# Patient Record
Sex: Male | Born: 1980 | Race: White | Hispanic: No | Marital: Single | Smoking: Never smoker
Health system: Southern US, Community
[De-identification: ages and names within clinical notes are randomized; demographics above are authoritative.]

## PROBLEM LIST (undated history)

## (undated) DIAGNOSIS — T7840XA Allergy, unspecified, initial encounter: Secondary | ICD-10-CM

---

## 2005-09-16 ENCOUNTER — Emergency Department (HOSPITAL_COMMUNITY): Admission: EM | Admit: 2005-09-16 | Discharge: 2005-09-16 | Payer: Self-pay | Admitting: Emergency Medicine

## 2007-03-23 ENCOUNTER — Ambulatory Visit: Payer: Self-pay | Admitting: Internal Medicine

## 2007-03-24 ENCOUNTER — Ambulatory Visit: Payer: Self-pay | Admitting: *Deleted

## 2009-02-12 ENCOUNTER — Emergency Department (HOSPITAL_COMMUNITY): Admission: EM | Admit: 2009-02-12 | Discharge: 2009-02-12 | Payer: Self-pay | Admitting: Emergency Medicine

## 2010-04-07 LAB — POCT I-STAT, CHEM 8
BUN: 22 mg/dL (ref 6–23)
Chloride: 105 mEq/L (ref 96–112)
Creatinine, Ser: 1.2 mg/dL (ref 0.4–1.5)
Sodium: 138 mEq/L (ref 135–145)

## 2016-03-02 ENCOUNTER — Encounter (HOSPITAL_COMMUNITY): Payer: Self-pay

## 2016-03-02 DIAGNOSIS — R112 Nausea with vomiting, unspecified: Secondary | ICD-10-CM | POA: Insufficient documentation

## 2016-03-02 DIAGNOSIS — R509 Fever, unspecified: Secondary | ICD-10-CM | POA: Insufficient documentation

## 2016-03-02 MED ORDER — ACETAMINOPHEN 325 MG PO TABS
650.0000 mg | ORAL_TABLET | Freq: Once | ORAL | Status: AC
  Administered 2016-03-02: 650 mg via ORAL

## 2016-03-02 MED ORDER — ACETAMINOPHEN 325 MG PO TABS
ORAL_TABLET | ORAL | Status: AC
  Filled 2016-03-02: qty 2

## 2016-03-02 NOTE — ED Triage Notes (Signed)
Pt complaining of nausea and vomiting since this morning. Pt complaining of fevers/chills. Pt also complaining of generalized body aches.

## 2016-03-03 ENCOUNTER — Emergency Department (HOSPITAL_COMMUNITY)
Admission: EM | Admit: 2016-03-03 | Discharge: 2016-03-03 | Disposition: A | Discharge status: Home / Self Care | Payer: Self-pay | Attending: Emergency Medicine | Admitting: Emergency Medicine

## 2016-03-03 DIAGNOSIS — R509 Fever, unspecified: Secondary | ICD-10-CM

## 2016-03-03 DIAGNOSIS — R112 Nausea with vomiting, unspecified: Secondary | ICD-10-CM

## 2016-03-03 MED ORDER — ONDANSETRON HCL 4 MG PO TABS: 4.0000 mg | ORAL_TABLET | Freq: Three times a day (TID) | ORAL | 0 refills | Status: DC | PRN

## 2016-03-03 MED ORDER — SODIUM CHLORIDE 0.9 % IV BOLUS (SEPSIS)
1000.0000 mL | Freq: Once | INTRAVENOUS | Status: AC
  Administered 2016-03-03: 1000 mL via INTRAVENOUS

## 2016-03-03 NOTE — ED Provider Notes (Signed)
[facility]-EMERGENCY DEPT Provider Note   CSN: [id number] Arrival date & time: 03/02/16  1843     History   Chief Complaint Chief Complaint  Patient presents with  . Generalized Body Aches  . Emesis    HPI [facility] is a 36 y.o. male.  Patient presents with nausea, vomiting, chills and generalized body aches since this morning (03/02/16). He was unaware he had a fever until seen in the emergency department. No diarrhea, congestion, cough, sore throat. He reports sick family members with similar symptoms. He denies significant pain. No hematemesis.   The history is provided by the patient. No language interpreter was used.  Emesis   Associated symptoms include myalgias. Pertinent negatives include no chills, no fever and no headaches.    History reviewed. No pertinent past medical history.  There are no active problems to display for this patient.   History reviewed. No pertinent surgical history.     Home Medications    Prior to Admission medications   Not on File    Family History History reviewed. No pertinent family history.  Social History Social History  Substance Use Topics  . Smoking status: Never Smoker  . Smokeless tobacco: Never Used  . Alcohol use No     Allergies   Contrast media [iodinated diagnostic agents]   Review of Systems Review of Systems  Constitutional: Negative for chills and fever.  HENT: Negative.  Negative for congestion.   Respiratory: Negative.  Negative for shortness of breath.   Cardiovascular: Negative.  Negative for chest pain.  Gastrointestinal: Positive for nausea and vomiting.  Genitourinary: Negative.  Negative for decreased urine volume.  Musculoskeletal: Positive for myalgias.  Skin: Negative.   Neurological: Negative.  Negative for syncope, weakness and headaches.     Physical Exam Updated Vital Signs BP 131/87   Pulse 114   Temp 99.2 F (37.3 C)   Resp 18   SpO2 97%   Physical Exam    Constitutional: He is oriented to person, place, and time. He appears well-developed and well-nourished.  HENT:  Head: Normocephalic.  Mouth/Throat: Oropharynx is clear and moist.  Neck: Normal range of motion. Neck supple.  Cardiovascular: Regular rhythm.  Tachycardia present.   Pulmonary/Chest: Effort normal and breath sounds normal. He has no wheezes. He has no rales.  Abdominal: Soft. Bowel sounds are normal. There is no tenderness. There is no rebound and no guarding.  Musculoskeletal: Normal range of motion.  Neurological: He is alert and oriented to person, place, and time.  Skin: Skin is warm and dry. No rash noted.  Psychiatric: He has a normal mood and affect.     ED Treatments / Results  Labs (all labs ordered are listed, but only abnormal results are displayed) Labs Reviewed - No data to display  EKG  EKG Interpretation None       Radiology No results found.  Procedures Procedures (including critical care time)  Medications Ordered in ED Medications  acetaminophen (TYLENOL) 325 MG tablet (not administered)  sodium chloride 0.9 % bolus 1,000 mL (not administered)  acetaminophen (TYLENOL) tablet 650 mg (650 mg Oral Given 03/02/16 2152)     Initial Impression / Assessment and Plan / ED Course  I have reviewed the triage vital signs and the nursing notes.  Pertinent labs & imaging results that were available during my care of the patient were reviewed by me and considered in my medical decision making (see chart for details).     Patient presents  with N, V, fever, body aches. No URI symptoms. He is tachycardic on arrival, feels generally ill. IVF's given with zofran. Patient feeling much better on recheck. Tolerating PO fluids. Tachycardia resolved. He is felt appropriate for discharge home.   Final Clinical Impressions(s) / ED Diagnoses   Final diagnoses:  None   1. Nausea and vomiting 2. Fever  New Prescriptions New Prescriptions   No medications  on file     Elpidio Anis, PA-C 03/03/16 0401    Layla Maw Ward, DO 03/03/16 0505

## 2016-12-07 ENCOUNTER — Ambulatory Visit
Admission: RE | Admit: 2016-12-07 | Discharge: 2016-12-07 | Disposition: A | Discharge status: Home / Self Care | Payer: Worker's Compensation | Source: Ambulatory Visit | Attending: Nurse Practitioner | Admitting: Nurse Practitioner

## 2016-12-07 ENCOUNTER — Other Ambulatory Visit: Payer: Self-pay | Admitting: Nurse Practitioner

## 2016-12-07 DIAGNOSIS — R52 Pain, unspecified: Secondary | ICD-10-CM

## 2017-11-16 ENCOUNTER — Encounter (HOSPITAL_COMMUNITY): Payer: Self-pay | Admitting: Emergency Medicine

## 2017-11-16 ENCOUNTER — Emergency Department (HOSPITAL_COMMUNITY)
Admission: EM | Admit: 2017-11-16 | Discharge: 2017-11-16 | Disposition: A | Discharge status: Home / Self Care | Payer: Self-pay | Attending: Emergency Medicine | Admitting: Emergency Medicine

## 2017-11-16 ENCOUNTER — Other Ambulatory Visit: Payer: Self-pay

## 2017-11-16 DIAGNOSIS — L853 Xerosis cutis: Secondary | ICD-10-CM

## 2017-11-16 DIAGNOSIS — L989 Disorder of the skin and subcutaneous tissue, unspecified: Secondary | ICD-10-CM | POA: Insufficient documentation

## 2017-11-16 MED ORDER — TRIAMCINOLONE ACETONIDE 0.1 % EX CREA: 1.0000 "application " | TOPICAL_CREAM | Freq: Two times a day (BID) | CUTANEOUS | 0 refills | Status: AC

## 2017-11-16 NOTE — ED Notes (Signed)
ED Provider at bedside. 

## 2017-11-16 NOTE — ED Triage Notes (Signed)
Pt reports his hands are red, swollen and peeling, causing him great pain.  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

## 2017-11-16 NOTE — ED Provider Notes (Signed)
[facility] Provider Note   CSN: [id number] Arrival date & time: 11/16/17  0137     History   Chief Complaint Chief Complaint  Patient presents with  . Hand Pain    HPI [facility] is a 37 y.o. male.  The history is provided by the patient and medical records.     37 y.o. M here with bilateral hand pain.  He works as a Theatre stage manager at [employer] and for the past few weeks the skin on his hands has been dry, scaling, and sometimes blistering.  States on days he does not work he keeps lotion on his hand which helps but then as soon as he returns to work it starts again.  He does use industrial strength dish detergent with some type of sanitzer--unsure of exact chemical make-up.  States it causes the skin on his hands to burn and causes great pain when trying to use his hands.  He denies any fever/chills.    History reviewed. No pertinent past medical history.  There are no active problems to display for this patient.   History reviewed. No pertinent surgical history.      Home Medications    Prior to Admission medications   Medication Sig Start Date End Date Taking? Authorizing Provider  ondansetron (ZOFRAN) 4 MG tablet Take 1 tablet (4 mg total) by mouth every 8 (eight) hours as needed for nausea or vomiting. 03/03/16   Elpidio Anis, PA-C    Family History No family history on file.  Social History Social History   Tobacco Use  . Smoking status: Never Smoker  . Smokeless tobacco: Never Used  Substance Use Topics  . Alcohol use: No  . Drug use: No     Allergies   Contrast media [iodinated diagnostic agents]   Review of Systems Review of Systems  Skin:       Dry skin/scaling  All other systems reviewed and are negative.    Physical Exam Updated Vital Signs BP (!) 156/103 (BP Location: Right Arm)   Pulse (!) 110   Temp 98 F (36.7 C) (Oral)   Resp 16   Ht 5\' 8"  (1.727 m)   Wt 97.5 kg   SpO2 98%    BMI 32.69 kg/m   Physical Exam  Constitutional: He is oriented to person, place, and time. He appears well-developed and well-nourished.  HENT:  Head: Normocephalic and atraumatic.  Mouth/Throat: Oropharynx is clear and moist.  Eyes: Pupils are equal, round, and reactive to light. Conjunctivae and EOM are normal.  Neck: Normal range of motion.  Cardiovascular: Normal rate, regular rhythm and normal heart sounds.  Pulmonary/Chest: Effort normal and breath sounds normal. No stridor. No respiratory distress.  Abdominal: Soft. Bowel sounds are normal. There is no tenderness. There is no rebound.  Musculoskeletal: Normal range of motion.  Bilateral palms with dry, scaling, cracked skin; some minor areas of blisters noted; no drainage or bleeding; no signs of abscess formation  Neurological: He is alert and oriented to person, place, and time.  Skin: Skin is warm and dry.  Psychiatric: He has a normal mood and affect.  Nursing note and vitals reviewed.    ED Treatments / Results  Labs (all labs ordered are listed, but only abnormal results are displayed) Labs Reviewed - No data to display  EKG None  Radiology No results found.  Procedures Procedures (including critical care time)  Medications Ordered in ED Medications - No data to display  Initial Impression / Assessment and Plan / ED Course  I have reviewed the triage vital signs and the nursing notes.  Pertinent labs & imaging results that were available during my care of the patient were reviewed by me and considered in my medical decision making (see chart for details).  37 y.o. M here with bilateral hand pain.  Has dry, cracked, scaling skin with some areas of blistering on his palms.  There are no signs of infection.  Suspect this is likely from continuous immersion into the water while washing dishes, possibly some chemical component given industrial strength cleaners and soaps.  Will start on Kenalog ointment,  discussed trying to keep his hands as moist as possible, can try using salt/gloves over the hands at night after application of Kenalog.  Discussed also using Vaseline intermittently and continue use of lotions.  Can follow-up with PCP if any ongoing issues.  He will return here for any new or worsening symptoms.  Final Clinical Impressions(s) / ED Diagnoses   Final diagnoses:  Dry skin    ED Discharge Orders         Ordered    triamcinolone cream (KENALOG) 0.1 %  2 times daily     11/16/17 0404           Garlon Hatchet, PA-C 11/16/17 Harley Alto, MD 11/16/17 [phone]

## 2017-11-16 NOTE — Discharge Instructions (Signed)
Take the prescribed medication as directed.  Try to keep hands moisturized-- can use lotion, vaseline, etc.  May help to cover hands with socks/gloves at night to keep moisture in. Follow-up with your primary care doctor. Return to the ED for new or worsening symptoms.

## 2018-03-21 ENCOUNTER — Other Ambulatory Visit: Payer: Self-pay

## 2018-03-21 ENCOUNTER — Emergency Department (HOSPITAL_COMMUNITY)
Admission: EM | Admit: 2018-03-21 | Discharge: 2018-03-21 | Disposition: A | Discharge status: Home / Self Care | Payer: Self-pay | Attending: Emergency Medicine | Admitting: Emergency Medicine

## 2018-03-21 ENCOUNTER — Encounter (HOSPITAL_COMMUNITY): Payer: Self-pay | Admitting: *Deleted

## 2018-03-21 DIAGNOSIS — J020 Streptococcal pharyngitis: Secondary | ICD-10-CM | POA: Insufficient documentation

## 2018-03-21 LAB — GROUP A STREP BY PCR: Group A Strep by PCR: DETECTED — AB

## 2018-03-21 MED ORDER — PENICILLIN G BENZATHINE 1200000 UNIT/2ML IM SUSP
1.2000 10*6.[IU] | Freq: Once | INTRAMUSCULAR | Status: AC
  Administered 2018-03-21: 1.2 10*6.[IU] via INTRAMUSCULAR
  Filled 2018-03-21: qty 2

## 2018-03-21 MED ORDER — DEXAMETHASONE SODIUM PHOSPHATE 10 MG/ML IJ SOLN
10.0000 mg | Freq: Once | INTRAMUSCULAR | Status: AC
  Administered 2018-03-21: 10 mg via INTRAMUSCULAR
  Filled 2018-03-21: qty 1

## 2018-03-21 MED ORDER — KETOROLAC TROMETHAMINE 15 MG/ML IJ SOLN
15.0000 mg | Freq: Once | INTRAMUSCULAR | Status: DC
  Filled 2018-03-21: qty 1

## 2018-03-21 MED ORDER — IBUPROFEN 400 MG PO TABS
400.0000 mg | ORAL_TABLET | Freq: Once | ORAL | Status: AC
  Administered 2018-03-21: 400 mg via ORAL
  Filled 2018-03-21: qty 1

## 2018-03-21 NOTE — Discharge Instructions (Addendum)
As discussed, your evaluation today has been largely reassuring.  But, it is important that you monitor your condition carefully, and do not hesitate to return to the ED if you develop new, or concerning changes in your condition. ? ?Otherwise, please follow-up with your physician for appropriate ongoing care. ? ?

## 2018-03-21 NOTE — ED Provider Notes (Signed)
[facility] EMERGENCY DEPARTMENT Provider Note   CSN: [id number] Arrival date & time: 03/21/18  1706    History   Chief Complaint Chief Complaint  Patient presents with  . Sore Throat  . Fever    HPI [facility] is a 38 y.o. male.     HPI Presents with concern of sore throat, fever. Patient was well until earlier today He noticed throat fullness, symmetric, with soreness with swallowing, and had subjective fever earlier in the day. No ongoing fever, nausea, vomiting, or other complaints. No medication taken for pain relief per Patient was well prior to the onset of symptoms. He works in a [employer]. History reviewed. No pertinent past medical history.  There are no active problems to display for this patient.   History reviewed. No pertinent surgical history.      Home Medications    Prior to Admission medications   Medication Sig Start Date End Date Taking? Authorizing Provider  acetaminophen (TYLENOL) 500 MG tablet Take 1,000 mg by mouth every 6 (six) hours as needed for mild pain or fever.   Yes [provider]  ondansetron (ZOFRAN) 4 MG tablet Take 1 tablet (4 mg total) by mouth every 8 (eight) hours as needed for nausea or vomiting. Patient not taking: Reported on 03/21/2018 03/03/16   Elpidio Anis, PA-C  triamcinolone cream (KENALOG) 0.1 % Apply 1 application topically 2 (two) times daily. Patient not taking: Reported on 03/21/2018 11/16/17   Garlon Hatchet, PA-C    Family History No family history on file.  Social History Social History   Tobacco Use  . Smoking status: Never Smoker  . Smokeless tobacco: Never Used  Substance Use Topics  . Alcohol use: No  . Drug use: No     Allergies   Contrast media [iodinated diagnostic agents]   Review of Systems Review of Systems  Constitutional:       Per HPI, otherwise negative  HENT:       Per HPI, otherwise negative  Respiratory:       Per HPI,  otherwise negative  Cardiovascular:       Per HPI, otherwise negative  Gastrointestinal: Negative for vomiting.  Endocrine:       Negative aside from HPI  Genitourinary:       Neg aside from HPI   Musculoskeletal:       Per HPI, otherwise negative  Skin: Negative.   Neurological: Negative for syncope.     Physical Exam Updated Vital Signs BP (!) 135/99 (BP Location: Right Arm)   Pulse 79   Temp 98 F (36.7 C) (Oral)   Resp 16   SpO2 95%   Physical Exam Vitals signs and nursing note reviewed.  Constitutional:      General: He is not in acute distress.    Appearance: He is well-developed.  HENT:     Head: Normocephalic and atraumatic.     Comments: Mild discomfort with palpation of the throat, no gross posterior oropharyngeal asymmetry, though there is poor visualization. Eyes:     Conjunctiva/sclera: Conjunctivae normal.  Cardiovascular:     Rate and Rhythm: Normal rate and regular rhythm.  Pulmonary:     Effort: Pulmonary effort is normal. No respiratory distress.     Breath sounds: No stridor.  Abdominal:     General: There is no distension.  Skin:    General: Skin is warm and dry.  Neurological:     Mental Status: He is  alert and oriented to person, place, and time.      ED Treatments / Results  Labs (all labs ordered are listed, but only abnormal results are displayed) Labs Reviewed  GROUP A STREP BY PCR - Abnormal; Notable for the following components:      Result Value   Group A Strep by PCR DETECTED (*)    All other components within normal limits     Procedures Procedures (including critical care time)  Medications Ordered in ED Medications  ketorolac (TORADOL) 15 MG/ML injection 15 mg (15 mg Intramuscular Refused 03/21/18 1844)  ibuprofen (ADVIL,MOTRIN) tablet 400 mg (has no administration in time range)  penicillin g benzathine (BICILLIN LA) 1200000 UNIT/2ML injection 1.2 Million Units (1.2 Million Units Intramuscular Given 03/21/18 1832)    dexamethasone (DECADRON) injection 10 mg (10 mg Intramuscular Given 03/21/18 1832)     Initial Impression / Assessment and Plan / ED Course  I have reviewed the triage vital signs and the nursing notes.  Pertinent labs & imaging results that were available during my care of the patient were reviewed by me and considered in my medical decision making (see chart for details).    On repeat exam the patient is in no distress. He has received intramuscular Bicillin and Decadron. He will take oral ibuprofen, per his request. We discussed all findings including strep positive result, need for home care, including ibuprofen, scheduled, rest, fluids. Patient discharged in stable condition and absent evidence for deep space infection, bacteremia, sepsis.  Final Clinical Impressions(s) / ED Diagnoses   Final diagnoses:  Strep pharyngitis     Gerhard Munch, MD 03/21/18 [phone]

## 2018-03-21 NOTE — ED Triage Notes (Signed)
Pt reports sore throat and 101.5 fever today.  Pain in his throat is worse when swallowing.

## 2018-03-30 ENCOUNTER — Emergency Department (HOSPITAL_COMMUNITY)
Admission: EM | Admit: 2018-03-30 | Discharge: 2018-03-30 | Disposition: A | Discharge status: Home / Self Care | Payer: Self-pay | Attending: Emergency Medicine | Admitting: Emergency Medicine

## 2018-03-30 ENCOUNTER — Other Ambulatory Visit: Payer: Self-pay

## 2018-03-30 ENCOUNTER — Encounter (HOSPITAL_COMMUNITY): Payer: Self-pay

## 2018-03-30 DIAGNOSIS — X500XXA Overexertion from strenuous movement or load, initial encounter: Secondary | ICD-10-CM | POA: Insufficient documentation

## 2018-03-30 DIAGNOSIS — Y9289 Other specified places as the place of occurrence of the external cause: Secondary | ICD-10-CM | POA: Insufficient documentation

## 2018-03-30 DIAGNOSIS — M5441 Lumbago with sciatica, right side: Secondary | ICD-10-CM | POA: Insufficient documentation

## 2018-03-30 DIAGNOSIS — Y9389 Activity, other specified: Secondary | ICD-10-CM | POA: Insufficient documentation

## 2018-03-30 DIAGNOSIS — Y990 Civilian activity done for income or pay: Secondary | ICD-10-CM | POA: Insufficient documentation

## 2018-03-30 MED ORDER — METHOCARBAMOL 500 MG PO TABS: 500.0000 mg | ORAL_TABLET | Freq: Two times a day (BID) | ORAL | 0 refills | Status: AC

## 2018-03-30 MED ORDER — KETOROLAC TROMETHAMINE 30 MG/ML IJ SOLN
30.0000 mg | Freq: Once | INTRAMUSCULAR | Status: AC
  Administered 2018-03-30: 30 mg via INTRAMUSCULAR
  Filled 2018-03-30: qty 1

## 2018-03-30 NOTE — ED Provider Notes (Signed)
[facility] EMERGENCY DEPARTMENT Provider Note   CSN: [id number] Arrival date & time: 03/30/18  1647    History   Chief Complaint Chief Complaint  Patient presents with   Back Pain    HPI [facility] is a 38 y.o. male presents for evaluation of lower back pain that began yesterday.  Patient reports that he was at work and was standing on a shelf when he slipped off the shelf and started to fall.  He reports he caught himself and states he did not hit the floor or have any head injury.  He states that his back twisted during them.  Since then, has had pain to the back that extends into the right lower extremity. Pain radiates down the posterior aspect of RLE.  He describes pain as sharp, shooting.  He reports that he took ibuprofen and 1 of his mom's oxycodone yesterday with some improvement in symptoms.  He comes today because he continues to have pain.  He does report that he has been able to ambulate but does report worsening pain when he walks.  He also reports worsening pain with movement, bending.  Patient states that when he lifts his leg, exacerbates his pain. Denies fevers, weight loss, numbness/weakness of upper and lower extremities, bowel/bladder incontinence, saddle anesthesia, history of back surgery, history of IVDA.      The history is provided by the patient.    History reviewed. No pertinent past medical history.  There are no active problems to display for this patient.   History reviewed. No pertinent surgical history.      Home Medications    Prior to Admission medications   Medication Sig Start Date End Date Taking? Authorizing Provider  acetaminophen (TYLENOL) 500 MG tablet Take 1,000 mg by mouth every 6 (six) hours as needed for mild pain or fever.    [provider]  methocarbamol (ROBAXIN) 500 MG tablet Take 1 tablet (500 mg total) by mouth 2 (two) times daily. 03/30/18   Maxwell Caul, PA-C  ondansetron (ZOFRAN) 4  MG tablet Take 1 tablet (4 mg total) by mouth every 8 (eight) hours as needed for nausea or vomiting. Patient not taking: Reported on 03/21/2018 03/03/16   Elpidio Anis, PA-C  triamcinolone cream (KENALOG) 0.1 % Apply 1 application topically 2 (two) times daily. Patient not taking: Reported on 03/21/2018 11/16/17   Garlon Hatchet, PA-C    Family History History reviewed. No pertinent family history.  Social History Social History   Tobacco Use   Smoking status: Never Smoker   Smokeless tobacco: Never Used  Substance Use Topics   Alcohol use: No   Drug use: No     Allergies   Contrast media [iodinated diagnostic agents]   Review of Systems Review of Systems  Constitutional: Negative for fever.  Musculoskeletal: Positive for back pain. Negative for neck pain.  Neurological: Negative for weakness and numbness.  All other systems reviewed and are negative.    Physical Exam Updated Vital Signs BP (!) 135/102    Pulse 92    Temp 98.1 F (36.7 C) (Oral)    Resp 16    SpO2 98%   Physical Exam Vitals signs and nursing note reviewed.  Constitutional:      Appearance: He is well-developed.  HENT:     Head: Normocephalic and atraumatic.  Eyes:     General: No scleral icterus.       Right eye: No discharge.  Left eye: No discharge.     Conjunctiva/sclera: Conjunctivae normal.  Neck:     Musculoskeletal: Full passive range of motion without pain.     Comments: Full flexion/extension and lateral movement of neck fully intact. No bony midline tenderness. No deformities or crepitus.  Cardiovascular:     Pulses:          Dorsalis pedis pulses are 2+ on the right side and 2+ on the left side.  Pulmonary:     Effort: Pulmonary effort is normal.  Musculoskeletal:     Thoracic back: He exhibits no tenderness.       Back:     Comments: No midline C or T-spine tenderness.  No deformities or crepitus noted.  Diffuse lumbar tenderness noted to midline that extends into the  right gluteal muscle.  No deformity or crepitus noted.  Limited flexion/extension secondary to pain.  Skin:    General: Skin is warm and dry.  Neurological:     Mental Status: He is alert.     Comments: Follows commands, Moves all extremities  5/5 strength to BUE and BLE, does report pain with extension of right lower extremity.  Dorsiflexion and plantar flexion intact without any difficulties. Sensation intact throughout all major nerve distributions Normal gait Positive SLR on right  Psychiatric:        Speech: Speech normal.        Behavior: Behavior normal.      TTP noted ot Lower lumbar region pain with extension  Pain to palpation to right leg pain    ED Treatments / Results  Labs (all labs ordered are listed, but only abnormal results are displayed) Labs Reviewed - No data to display  EKG None  Radiology No results found.  Procedures Procedures (including critical care time)  Medications Ordered in ED Medications  ketorolac (TORADOL) 30 MG/ML injection 30 mg (30 mg Intramuscular Given 03/30/18 2047)     Initial Impression / Assessment and Plan / ED Course  I have reviewed the triage vital signs and the nursing notes.  Pertinent labs & imaging results that were available during my care of the patient were reviewed by me and considered in my medical decision making (see chart for details).        38 year old male who presents for evaluation of back pain that began yesterday after mechanical fall.  He reports he did not hit the ground but states that his back twisted.  Has had pain to the back that radiates in the right lower extremity since then.  No numbness/weakness, urinary or bowel incontinence, saddle anesthesia.  Has been able to ambulate but does report worsening pain with ambulation. Patient is afebrile, non-toxic appearing, sitting comfortably on examination table. Vital signs reviewed and stable.  No neuro deficits noted on exam.  He does have pain with  extension but he has full strength once he is able to push through the pain.  He does have positive straight leg raise.  Consider sciatica versus musculoskeletal pain.  Do not suspect fracture dislocation given lack of trauma.  Additionally, history/physical exam not concerning for cauda equina, spinal abscess.  At this time, no indication for acute emergent MRI imaging.  Discussed with patient regarding utility of x-ray imaging at this time.  Given lack of trauma, my suspicion for acute fracture is very low.  I did offer x-ray evaluation.  After after discussion with patient, we engaged in shared decision making patient offered declined x-ray.  We will plan to  treat symptomatically. At this time, patient exhibits no emergent life-threatening condition that require further evaluation in ED or admission. Patient had ample opportunity for questions and discussion. All patient's questions were answered with full understanding. Strict return precautions discussed. Patient expresses understanding and agreement to plan.   Portions of this note were generated with Scientist, clinical (histocompatibility and immunogenetics). Dictation errors may occur despite best attempts at proofreading.    Final Clinical Impressions(s) / ED Diagnoses   Final diagnoses:  Acute right-sided low back pain with right-sided sciatica    ED Discharge Orders         Ordered    methocarbamol (ROBAXIN) 500 MG tablet  2 times daily     03/30/18 2103           Rosana Hoes 03/30/18 2204    Arby Barrette, MD 04/02/18 1320

## 2018-03-30 NOTE — ED Notes (Signed)
Patient verbalizes understanding of discharge instructions. Opportunity for questioning and answers were provided. Armband removed by staff, pt discharged from ED.  

## 2018-03-30 NOTE — ED Triage Notes (Signed)
Pt states he injured his back at work yesterday pt states pain radiates down his right leg, ambulatory but steady gait.

## 2018-03-30 NOTE — Discharge Instructions (Signed)
You can take Tylenol or Ibuprofen as directed for pain. You can alternate Tylenol and Ibuprofen every 4 hours. If you take Tylenol at 1pm, then you can take Ibuprofen at 5pm. Then you can take Tylenol again at 9pm.   Take Robaxin as prescribed. This medication will make you drowsy so do not drive or drink alcohol when taking it.  Return to the Emergency Department immediately for any worsening back pain, neck pain, difficulty walking, numbness/weaknss of your arms or legs, urinary or bowel accidents, fever or any other worsening or concerning symptoms.  

## 2018-07-01 ENCOUNTER — Emergency Department (HOSPITAL_COMMUNITY): Payer: Self-pay

## 2018-07-01 ENCOUNTER — Encounter (HOSPITAL_COMMUNITY): Payer: Self-pay | Admitting: Emergency Medicine

## 2018-07-01 ENCOUNTER — Emergency Department (HOSPITAL_COMMUNITY)
Admission: EM | Admit: 2018-07-01 | Discharge: 2018-07-01 | Disposition: A | Discharge status: Home / Self Care | Payer: Self-pay | Attending: Emergency Medicine | Admitting: Emergency Medicine

## 2018-07-01 DIAGNOSIS — Z79899 Other long term (current) drug therapy: Secondary | ICD-10-CM | POA: Insufficient documentation

## 2018-07-01 DIAGNOSIS — M722 Plantar fascial fibromatosis: Secondary | ICD-10-CM | POA: Insufficient documentation

## 2018-07-01 MED ORDER — IBUPROFEN 600 MG PO TABS: 600.0000 mg | ORAL_TABLET | Freq: Four times a day (QID) | ORAL | 0 refills | Status: AC | PRN

## 2018-07-01 NOTE — ED Provider Notes (Signed)
[facility]   CSN: [id number] Arrival date & time: 07/01/18  1045     History   Chief Complaint Chief Complaint  Patient presents with  . Foot Pain    HPI John Beck is a 38 y.o. male.     Pt thinks he has a bone spur.  Pt complains of pain in his heel  The history is provided by the patient. No language interpreter was used.  Foot Pain This is a new problem. The current episode started 2 days ago. The problem occurs constantly. The problem has been rapidly worsening. Nothing aggravates the symptoms. Nothing relieves the symptoms. He has tried nothing for the symptoms. The treatment provided no relief.    History reviewed. No pertinent past medical history.  There are no active problems to display for this patient.   History reviewed. No pertinent surgical history.      Home Medications    Prior to Admission medications   Medication Sig Start Date End Date Taking? Authorizing Provider  acetaminophen (TYLENOL) 500 MG tablet Take 1,000 mg by mouth every 6 (six) hours as needed for mild pain or fever.    [provider]  ibuprofen (ADVIL) 600 MG tablet Take 1 tablet (600 mg total) by mouth every 6 (six) hours as needed. 07/01/18   Elson AreasSofia, Wray Goehring K, PA-C  methocarbamol (ROBAXIN) 500 MG tablet Take 1 tablet (500 mg total) by mouth 2 (two) times daily. 03/30/18   Maxwell CaulLayden, Lindsey A, PA-C  ondansetron (ZOFRAN) 4 MG tablet Take 1 tablet (4 mg total) by mouth every 8 (eight) hours as needed for nausea or vomiting. Patient not taking: Reported on 03/21/2018 03/03/16   Elpidio AnisUpstill, Shari, PA-C  triamcinolone cream (KENALOG) 0.1 % Apply 1 application topically 2 (two) times daily. Patient not taking: Reported on 03/21/2018 11/16/17   Garlon HatchetSanders, Lisa M, PA-C    Family History No family history on file.  Social History Social History   Tobacco Use  . Smoking status: Never Smoker  . Smokeless tobacco: Never Used   Substance Use Topics  . Alcohol use: No  . Drug use: No     Allergies   Contrast media [iodinated diagnostic agents]   Review of Systems Review of Systems  Musculoskeletal: Positive for arthralgias.  All other systems reviewed and are negative.    Physical Exam Updated Vital Signs BP (!) 125/93 (BP Location: Right Arm)   Pulse 76   Temp 97.8 F (36.6 C) (Oral)   Resp 18   SpO2 98%   Physical Exam Vitals signs reviewed.  Cardiovascular:     Rate and Rhythm: Normal rate.  Pulmonary:     Effort: Pulmonary effort is normal.  Musculoskeletal:        General: Swelling and tenderness present.     Comments: Tender left heel  Skin:    General: Skin is warm.  Neurological:     General: No focal deficit present.  Psychiatric:        Mood and Affect: Mood normal.      ED Treatments / Results  Labs (all labs ordered are listed, but only abnormal results are displayed) Labs Reviewed - No data to display  EKG    Radiology Dg Foot Complete Left  Result Date: 07/01/2018 CLINICAL DATA:  Chronic foot and heel pain. EXAM: LEFT FOOT - COMPLETE 3+ VIEW COMPARISON:  Left foot x-rays dated December 07, 2016. FINDINGS: There is no evidence of fracture or dislocation. There  is no evidence of arthropathy or other focal bone abnormality. Soft tissues are unremarkable. IMPRESSION: Negative. Electronically Signed   By: Titus Dubin M.D.   On: 07/01/2018 12:27    Procedures Procedures (including critical care time)  Medications Ordered in ED Medications - No data to display   Initial Impression / Assessment and Plan / ED Course  I have reviewed the triage vital signs and the nursing notes.  Pertinent labs & imaging results that were available during my care of the patient were reviewed by me and considered in my medical decision making (see chart for details).        MDM  xrays reviewed and discussed with pt.  Pt advised to get heel pads and to make sure he wears go  heel support   Final Clinical Impressions(s) / ED Diagnoses   Final diagnoses:  Plantar fasciitis of left foot    ED Discharge Orders         Ordered    ibuprofen (ADVIL) 600 MG tablet  Every 6 hours PRN     07/01/18 1326        An After Visit Summary was printed and given to the patient.    Fransico Meadow, [place] 07/01/18 1547    Sherwood Gambler, MD 07/02/18 [phone]

## 2018-07-01 NOTE — Discharge Instructions (Signed)
Return if any problems. See the Orthopaedist for recheck in 1 week if pain persist 

## 2018-07-01 NOTE — ED Triage Notes (Signed)
Pt. Stated, John Beck got left heel pain that started 2 days ago, I work at [employer] and Im on my feet all the time.

## 2018-08-17 ENCOUNTER — Other Ambulatory Visit: Payer: Self-pay

## 2018-08-17 ENCOUNTER — Emergency Department (HOSPITAL_COMMUNITY)
Admission: EM | Admit: 2018-08-17 | Discharge: 2018-08-17 | Discharge status: Against Medical Advice | Payer: Self-pay | Attending: Emergency Medicine | Admitting: Emergency Medicine

## 2018-08-17 ENCOUNTER — Encounter (HOSPITAL_COMMUNITY): Payer: Self-pay

## 2018-08-17 DIAGNOSIS — Z5321 Procedure and treatment not carried out due to patient leaving prior to being seen by health care provider: Secondary | ICD-10-CM | POA: Insufficient documentation

## 2018-08-17 NOTE — ED Triage Notes (Signed)
Pt states that his left ear is clogged since earlier today. Denies any pain or drainage. Tried unclogging with a q tip

## 2018-09-07 ENCOUNTER — Other Ambulatory Visit: Payer: Self-pay

## 2018-09-07 ENCOUNTER — Emergency Department (HOSPITAL_COMMUNITY): Payer: Self-pay

## 2018-09-07 ENCOUNTER — Emergency Department (HOSPITAL_COMMUNITY)
Admission: EM | Admit: 2018-09-07 | Discharge: 2018-09-07 | Disposition: A | Discharge status: Home / Self Care | Payer: Self-pay | Attending: Emergency Medicine | Admitting: Emergency Medicine

## 2018-09-07 ENCOUNTER — Encounter (HOSPITAL_COMMUNITY): Payer: Self-pay | Admitting: Emergency Medicine

## 2018-09-07 DIAGNOSIS — Y998 Other external cause status: Secondary | ICD-10-CM | POA: Insufficient documentation

## 2018-09-07 DIAGNOSIS — W228XXA Striking against or struck by other objects, initial encounter: Secondary | ICD-10-CM | POA: Insufficient documentation

## 2018-09-07 DIAGNOSIS — Y9389 Activity, other specified: Secondary | ICD-10-CM | POA: Insufficient documentation

## 2018-09-07 DIAGNOSIS — S20211A Contusion of right front wall of thorax, initial encounter: Secondary | ICD-10-CM | POA: Insufficient documentation

## 2018-09-07 DIAGNOSIS — H6123 Impacted cerumen, bilateral: Secondary | ICD-10-CM

## 2018-09-07 DIAGNOSIS — Y92014 Private driveway to single-family (private) house as the place of occurrence of the external cause: Secondary | ICD-10-CM | POA: Insufficient documentation

## 2018-09-07 NOTE — Discharge Instructions (Addendum)
Please read attached information. If you experience any new or worsening signs or symptoms please return to the emergency room for evaluation. Please follow-up with your primary care provider or specialist as discussed.  °
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
## 2019-04-19 ENCOUNTER — Encounter (HOSPITAL_COMMUNITY): Payer: Self-pay

## 2019-04-19 ENCOUNTER — Emergency Department (HOSPITAL_COMMUNITY)
Admission: EM | Admit: 2019-04-19 | Discharge: 2019-04-19 | Disposition: A | Discharge status: Home / Self Care | Payer: HRSA Program | Attending: Emergency Medicine | Admitting: Emergency Medicine

## 2019-04-19 DIAGNOSIS — Z20822 Contact with and (suspected) exposure to covid-19: Secondary | ICD-10-CM | POA: Insufficient documentation

## 2019-04-19 DIAGNOSIS — E860 Dehydration: Secondary | ICD-10-CM | POA: Diagnosis not present

## 2019-04-19 DIAGNOSIS — R112 Nausea with vomiting, unspecified: Secondary | ICD-10-CM | POA: Diagnosis not present

## 2019-04-19 DIAGNOSIS — R197 Diarrhea, unspecified: Secondary | ICD-10-CM | POA: Diagnosis not present

## 2019-04-19 DIAGNOSIS — R110 Nausea: Secondary | ICD-10-CM

## 2019-04-19 LAB — COMPREHENSIVE METABOLIC PANEL
ALT: 33 U/L (ref 0–44)
AST: 28 U/L (ref 15–41)
Albumin: 4.1 g/dL (ref 3.5–5.0)
Alkaline Phosphatase: 45 U/L (ref 38–126)
Anion gap: 11 (ref 5–15)
BUN: 22 mg/dL — ABNORMAL HIGH (ref 6–20)
CO2: 23 mmol/L (ref 22–32)
Calcium: 9.5 mg/dL (ref 8.9–10.3)
Chloride: 105 mmol/L (ref 98–111)
Creatinine, Ser: 1.4 mg/dL — ABNORMAL HIGH (ref 0.61–1.24)
GFR calc Af Amer: >60 mL/min (ref >60)
GFR calc non Af Amer: >60 mL/min (ref >60)
Glucose, Bld: 107 mg/dL — ABNORMAL HIGH (ref 70–99)
Potassium: 4 mmol/L (ref 3.5–5.1)
Sodium: 139 mmol/L (ref 135–145)
Total Bilirubin: 0.7 mg/dL (ref 0.3–1.2)
Total Protein: 7.3 g/dL (ref 6.5–8.1)

## 2019-04-19 LAB — URINALYSIS, ROUTINE W REFLEX MICROSCOPIC
Bilirubin Urine: NEGATIVE
Glucose, UA: NEGATIVE mg/dL
Hgb urine dipstick: NEGATIVE
Ketones, ur: NEGATIVE mg/dL
Leukocytes,Ua: NEGATIVE
Nitrite: NEGATIVE
Protein, ur: NEGATIVE mg/dL
Specific Gravity, Urine: 1.016 (ref 1.005–1.030)
pH: 5 (ref 5.0–8.0)

## 2019-04-19 LAB — CBC
HCT: 48.6 % (ref 39.0–52.0)
Hemoglobin: 16 g/dL (ref 13.0–17.0)
MCH: 28.5 pg (ref 26.0–34.0)
MCHC: 32.9 g/dL (ref 30.0–36.0)
MCV: 86.5 fL (ref 80.0–100.0)
Platelets: 214 10*3/uL (ref 150–400)
RBC: 5.62 MIL/uL (ref 4.22–5.81)
RDW: 12.3 % (ref 11.5–15.5)
WBC: 14.4 10*3/uL — ABNORMAL HIGH (ref 4.0–10.5)
nRBC: 0 % (ref 0.0–0.2)

## 2019-04-19 LAB — LIPASE, BLOOD: Lipase: 27 U/L (ref 11–51)

## 2019-04-19 MED ORDER — ONDANSETRON HCL 4 MG PO TABS: 4.0000 mg | ORAL_TABLET | Freq: Three times a day (TID) | ORAL | 0 refills | Status: AC | PRN

## 2019-04-19 MED ORDER — SODIUM CHLORIDE 0.9 % IV BOLUS
1000.0000 mL | Freq: Once | INTRAVENOUS | Status: AC
  Administered 2019-04-19: 1000 mL via INTRAVENOUS

## 2019-04-19 MED ORDER — ACETAMINOPHEN 500 MG PO TABS
1000.0000 mg | ORAL_TABLET | Freq: Once | ORAL | Status: AC
Start: 2019-04-19 — End: 2019-04-19
  Administered 2019-04-19: 22:00:00 1000 mg via ORAL
  Filled 2019-04-19: qty 2

## 2019-04-19 NOTE — ED Notes (Addendum)
Pt given water & club crackers for PO challenge

## 2019-04-19 NOTE — ED Provider Notes (Signed)
[facility] EMERGENCY DEPARTMENT Provider Note   CSN: [id number] Arrival date & time: 04/19/19  1907     History Chief Complaint  Patient presents with  . Emesis    JULIE PAOLINI is a 39 y.o. male presenting for evaluation of nausea and diarrhea.  Patient states earlier today, he started to feel sick.  He had 2 episodes of diarrhea today, 4 episodes of emesis.  He denies blood in his emesis.  He reports chills, but no fevers.  He denies chest pain, shortness of breath, cough, abdominal pain, urinary symptoms.  He has not taken anything for his symptoms.  He states he is feeling much better at this time, no nausea.  He is tolerating soda without difficulty.  He has no medical problems, takes medications daily.  His mom is concerned he has Covid, so he is requesting testing, although no known exposure.  HPI     No past medical history on file.  There are no problems to display for this patient.   No past surgical history on file.     No family history on file.  Social History   Tobacco Use  . Smoking status: Never Smoker  . Smokeless tobacco: Never Used  Substance Use Topics  . Alcohol use: No  . Drug use: No    Home Medications Prior to Admission medications   Medication Sig Start Date End Date Taking? Authorizing Provider  acetaminophen (TYLENOL) 500 MG tablet Take 1,000 mg by mouth every 6 (six) hours as needed for mild pain or fever.   Yes [provider]  Doxylamine Succinate, Sleep, (SLEEP AID PO) Take 1 tablet by mouth at bedtime.   Yes [provider]  loratadine (CLARITIN) 10 MG tablet Take 10 mg by mouth daily.   Yes [provider]  ibuprofen (ADVIL) 600 MG tablet Take 1 tablet (600 mg total) by mouth every 6 (six) hours as needed. Patient not taking: Reported on 04/19/2019 07/01/18   Elson Areas, PA-C  methocarbamol (ROBAXIN) 500 MG tablet Take 1 tablet (500 mg total) by mouth 2 (two) times daily. Patient  not taking: Reported on 04/19/2019 03/30/18   Graciella Freer A, PA-C  ondansetron (ZOFRAN) 4 MG tablet Take 1 tablet (4 mg total) by mouth every 8 (eight) hours as needed for nausea or vomiting. 04/19/19   Akshath Mccarey, PA-C  triamcinolone cream (KENALOG) 0.1 % Apply 1 application topically 2 (two) times daily. Patient not taking: Reported on 03/21/2018 11/16/17   Garlon Hatchet, PA-C    Allergies    Contrast media [iodinated diagnostic agents]  Review of Systems   Review of Systems  Gastrointestinal: Positive for diarrhea and nausea.  All other systems reviewed and are negative.   Physical Exam Updated Vital Signs BP 113/84   Pulse (!) 104   Temp 99.1 F (37.3 C) (Oral)   Resp 17   Ht 5\' 8"  (1.727 m)   Wt 99.8 kg   SpO2 93%   BMI 33.45 kg/m   Physical Exam Vitals and nursing note reviewed.  Constitutional:      General: He is not in acute distress.    Appearance: He is well-developed.     Comments: Resting comfortably in the bed in no acute distress  HENT:     Head: Normocephalic and atraumatic.  Eyes:     Extraocular Movements: Extraocular movements intact.     Conjunctiva/sclera: Conjunctivae normal.     Pupils: Pupils are equal, round, and  reactive to light.  Cardiovascular:     Rate and Rhythm: Regular rhythm. Tachycardia present.     Pulses: Normal pulses.     Comments: Mildly tachycardic around 110 Pulmonary:     Effort: Pulmonary effort is normal. No respiratory distress.     Breath sounds: Normal breath sounds. No wheezing.  Abdominal:     General: There is no distension.     Palpations: Abdomen is soft. There is no mass.     Tenderness: There is no abdominal tenderness. There is no guarding or rebound.     Comments: No tenderness to palpation the abdomen.  Soft without rigidity, guarding, distention.  Negative rebound.  No peritonitis.  Musculoskeletal:        General: Normal range of motion.     Cervical back: Normal range of motion and neck supple.       Right lower leg: No edema.     Left lower leg: No edema.  Skin:    General: Skin is warm and dry.     Capillary Refill: Capillary refill takes less than 2 seconds.  Neurological:     Mental Status: He is alert and oriented to person, place, and time.     ED Results / Procedures / Treatments   Labs (all labs ordered are listed, but only abnormal results are displayed) Labs Reviewed  COMPREHENSIVE METABOLIC PANEL - Abnormal; Notable for the following components:      Result Value   Glucose, Bld 107 (*)    BUN 22 (*)    Creatinine, Ser 1.40 (*)    All other components within normal limits  CBC - Abnormal; Notable for the following components:   WBC 14.4 (*)    All other components within normal limits  SARS CORONAVIRUS 2 (TAT 6-24 HRS)  LIPASE, BLOOD  URINALYSIS, ROUTINE W REFLEX MICROSCOPIC    EKG None  Radiology No results found.  Procedures Procedures (including critical care time)  Medications Ordered in ED Medications  sodium chloride 0.9 % bolus 1,000 mL (0 mLs Intravenous Stopped 04/19/19 2255)  acetaminophen (TYLENOL) tablet 1,000 mg (1,000 mg Oral Given 04/19/19 2158)    ED Course  I have reviewed the triage vital signs and the nursing notes.  Pertinent labs & imaging results that were available during my care of the patient were reviewed by me and considered in my medical decision making (see chart for details).    MDM Rules/Calculators/A&P                      Pt presenting for evaluation of nausea and emesis.  On exam, he appears nontoxic.  He is mildly tachycardic, however he has no abdominal tenderness.  Symptoms are improving.  He is tolerating p.o. without difficulty.  Labs obtained from triage interpreted by me, overall reassuring.  He does have mild leukocytosis of 14, nonspecific.  His writing is 1.4, consistent with dehydration.  This may be the cause of his tachycardia.  Will give fluids, Tylenol for possible fever, as patient feels warm, and  reassess.  On reassessment patient states he is feeling well.  He is tolerating p.o. without difficulty.  On my evaluation, patient's heart rate is 99.  Covid test is pending.  Discussed continued symptomatic treatment, prompt return to the ER with any fever or pain.  At this time, patient appears safe for discharge.  Return precautions given.  Patient states he understands and agrees to plan.  Final Clinical Impression(s) / ED  Diagnoses Final diagnoses:  Nausea  Dehydration    Rx / DC Orders ED Discharge Orders         Ordered    ondansetron (ZOFRAN) 4 MG tablet  Every 8 hours PRN     04/19/19 2321           Alveria Apley, PA-C 04/19/19 2348    Milagros Loll, MD 04/24/19 [phone]

## 2019-04-19 NOTE — Discharge Instructions (Signed)
Make sure you are staying well-hydrated water. Use Zofran as needed for nausea vomiting. Return to the emergency room if you develop fevers, persistent vomiting, severe worsening pain, any new, worsening, or concerning symptoms.

## 2019-04-19 NOTE — ED Triage Notes (Addendum)
Pt arrives POV for eval of 1 episode of diarrhea, and 1 episode of emesis about 30 minutes PTA. Pt reports he called his mother and she told him it was probably covid, prompting him to present here. HR 130s in triage- pt reports he sometimes gets tachycardic w/out explanation and it self resolves, denies cardiac complaints at this time.  Pt denies any further episodes of N/V/D, denies acute complaints.

## 2019-04-20 LAB — SARS CORONAVIRUS 2 (TAT 6-24 HRS): SARS Coronavirus 2: NEGATIVE

## 2019-05-06 ENCOUNTER — Ambulatory Visit: Payer: Self-pay | Attending: Internal Medicine

## 2019-05-06 DIAGNOSIS — Z23 Encounter for immunization: Secondary | ICD-10-CM

## 2019-05-06 NOTE — Progress Notes (Signed)
   Covid-19 Vaccination Clinic  Name:  ADRAIN NESBIT    MRN: [id number] DOB: July 28, 1980  05/06/2019  Mr. Baik was observed post Covid-19 immunization for 30 minutes based on pre-vaccination screening without incident. He was provided with Vaccine Information Sheet and instruction to access the V-Safe system.   Mr. Canada was instructed to call 911 with any severe reactions post vaccine: Marland Kitchen Difficulty breathing  . Swelling of face and throat  . A fast heartbeat  . A bad rash all over body  . Dizziness and weakness   Immunizations Administered    Name Date Dose VIS Date Route   Pfizer COVID-19 Vaccine 05/06/2019  1:54 PM 0.3 mL 12/30/2018 Intramuscular   Manufacturer: [employer], [employer]   Lot: [id number]   NDC: [address]-4

## 2019-05-29 ENCOUNTER — Ambulatory Visit: Payer: Self-pay | Attending: Internal Medicine

## 2019-05-29 DIAGNOSIS — Z23 Encounter for immunization: Secondary | ICD-10-CM

## 2019-05-29 NOTE — Progress Notes (Signed)
   Covid-19 Vaccination Clinic  Name:  SHAINE NEWMARK    MRN: [id number] DOB: [birth year]  05/29/2019  Mr. Guevarra was observed post Covid-19 immunization for 15 minutes without incident. He was provided with Vaccine Information Sheet and instruction to access the V-Safe system.   Mr. Larsh was instructed to call 911 with any severe reactions post vaccine: Marland Kitchen Difficulty breathing  . Swelling of face and throat  . A fast heartbeat  . A bad rash all over body  . Dizziness and weakness   Immunizations Administered    Name Date Dose VIS Date Route   Pfizer COVID-19 Vaccine 05/29/2019 10:04 AM 0.3 mL 03/15/2018 Intramuscular   Manufacturer: [employer], [employer]   Lot: [id number]   NDC: [address]-3

## 2020-05-06 ENCOUNTER — Emergency Department (HOSPITAL_COMMUNITY): Payer: Self-pay

## 2020-05-06 ENCOUNTER — Encounter (HOSPITAL_COMMUNITY): Payer: Self-pay | Admitting: Emergency Medicine

## 2020-05-06 ENCOUNTER — Emergency Department (HOSPITAL_COMMUNITY)
Admission: EM | Admit: 2020-05-06 | Discharge: 2020-05-06 | Disposition: A | Discharge status: Home / Self Care | Payer: Self-pay | Attending: Emergency Medicine | Admitting: Emergency Medicine

## 2020-05-06 ENCOUNTER — Other Ambulatory Visit: Payer: Self-pay

## 2020-05-06 DIAGNOSIS — R002 Palpitations: Secondary | ICD-10-CM | POA: Insufficient documentation

## 2020-05-06 DIAGNOSIS — R42 Dizziness and giddiness: Secondary | ICD-10-CM | POA: Insufficient documentation

## 2020-05-06 HISTORY — DX: Allergy, unspecified, initial encounter: T78.40XA

## 2020-05-06 LAB — COMPREHENSIVE METABOLIC PANEL WITH GFR
ALT: 22 U/L (ref 0–44)
AST: 24 U/L (ref 15–41)
Albumin: 3.8 g/dL (ref 3.5–5.0)
Alkaline Phosphatase: 40 U/L (ref 38–126)
Anion gap: 10 (ref 5–15)
BUN: 19 mg/dL (ref 6–20)
CO2: 23 mmol/L (ref 22–32)
Calcium: 9.1 mg/dL (ref 8.9–10.3)
Chloride: 106 mmol/L (ref 98–111)
Creatinine, Ser: 1.12 mg/dL (ref 0.61–1.24)
GFR, Estimated: >60 mL/min
Glucose, Bld: 93 mg/dL (ref 70–99)
Potassium: 3.7 mmol/L (ref 3.5–5.1)
Sodium: 139 mmol/L (ref 135–145)
Total Bilirubin: 1.1 mg/dL (ref 0.3–1.2)
Total Protein: 6.8 g/dL (ref 6.5–8.1)

## 2020-05-06 LAB — CBC WITH DIFFERENTIAL/PLATELET
Abs Immature Granulocytes: 0.07 10*3/uL (ref 0.00–0.07)
Basophils Absolute: 0.1 10*3/uL (ref 0.0–0.1)
Basophils Relative: 0 %
Eosinophils Absolute: 0.1 10*3/uL (ref 0.0–0.5)
Eosinophils Relative: 1 %
HCT: 47 % (ref 39.0–52.0)
Hemoglobin: 15.7 g/dL (ref 13.0–17.0)
Immature Granulocytes: 1 %
Lymphocytes Relative: 7 %
Lymphs Abs: 1 10*3/uL (ref 0.7–4.0)
MCH: 28.8 pg (ref 26.0–34.0)
MCHC: 33.4 g/dL (ref 30.0–36.0)
MCV: 86.2 fL (ref 80.0–100.0)
Monocytes Absolute: 0.7 10*3/uL (ref 0.1–1.0)
Monocytes Relative: 5 %
Neutro Abs: 12 10*3/uL — ABNORMAL HIGH (ref 1.7–7.7)
Neutrophils Relative %: 86 %
Platelets: 220 10*3/uL (ref 150–400)
RBC: 5.45 MIL/uL (ref 4.22–5.81)
RDW: 12.1 % (ref 11.5–15.5)
WBC: 13.9 10*3/uL — ABNORMAL HIGH (ref 4.0–10.5)
nRBC: 0 % (ref 0.0–0.2)

## 2020-05-06 NOTE — ED Provider Notes (Addendum)
[facility] EMERGENCY DEPARTMENT Provider Note   CSN: [id number] Arrival date & time: 05/06/20  1121     History Chief Complaint  Patient presents with  . Tachycardia    John Beck is a 40 y.o. male with no pertinent past medical history that presents the emergency department today for palpitations.  Patient states that over the last 5 months he has noticed that he has had intermittent palpitations, last for about 2 hours and then will resolve, worsening over the last couple of days.  Denies any chest pain or shortness of breath.  Patient states that it has started to recur since he met this man at work who does not like.  States that it occurs every time he is around him.  States that he does not like this man, he is ruining him and his girlfriend's relationship.  Patient states that today he was more stressed out than normal, started having the heart racing with some dizziness and feeling nauseous.  States that he is never had a panic attack before.  Symptoms are resolved now, however still concerned that there is something wrong with him.  Denies any fevers, chills, cough or URI symptoms.  Denies any nausea vomiting or abdominal pain.  Also states that he drinks about 2 cups of coffee a day and about 5 sodas during the day.  Denies any substance abuse.  HPI     Past Medical History:  Diagnosis Date  . Allergies     There are no problems to display for this patient.   History reviewed. No pertinent surgical history.     No family history on file.  Social History   Tobacco Use  . Smoking status: Never Smoker  . Smokeless tobacco: Never Used  Substance Use Topics  . Alcohol use: No  . Drug use: No    Home Medications Prior to Admission medications   Medication Sig Start Date End Date Taking? Authorizing Provider  acetaminophen (TYLENOL) 500 MG tablet Take 1,000 mg by mouth every 6 (six) hours as needed for mild pain or fever.    [provider]  Doxylamine Succinate, Sleep, (SLEEP AID PO) Take 1 tablet by mouth at bedtime.    [provider]  ibuprofen (ADVIL) 600 MG tablet Take 1 tablet (600 mg total) by mouth every 6 (six) hours as needed. Patient not taking: Reported on 04/19/2019 07/01/18   Fransico Meadow, PA-C  loratadine (CLARITIN) 10 MG tablet Take 10 mg by mouth daily.    [provider]  methocarbamol (ROBAXIN) 500 MG tablet Take 1 tablet (500 mg total) by mouth 2 (two) times daily. Patient not taking: Reported on 04/19/2019 03/30/18   Providence Lanius A, PA-C  ondansetron (ZOFRAN) 4 MG tablet Take 1 tablet (4 mg total) by mouth every 8 (eight) hours as needed for nausea or vomiting. 04/19/19   Caccavale, Sophia, PA-C  triamcinolone cream (KENALOG) 0.1 % Apply 1 application topically 2 (two) times daily. Patient not taking: Reported on 03/21/2018 11/16/17   Larene Pickett, PA-C    Allergies    Contrast media [iodinated diagnostic agents]  Review of Systems   Review of Systems  Constitutional: Negative for chills, diaphoresis, fatigue and fever.  HENT: Negative for congestion, sore throat and trouble swallowing.   Eyes: Negative for pain and visual disturbance.  Respiratory: Negative for cough, shortness of breath and wheezing.   Cardiovascular: Positive for palpitations. Negative for chest pain and leg swelling.  Gastrointestinal:  Negative for abdominal distention, abdominal pain, diarrhea, nausea and vomiting.  Genitourinary: Negative for difficulty urinating.  Musculoskeletal: Negative for back pain, neck pain and neck stiffness.  Skin: Negative for pallor.  Neurological: Negative for dizziness, speech difficulty, weakness and headaches.  Psychiatric/Behavioral: Negative for confusion.    Physical Exam Updated Vital Signs BP (!) 138/92 (BP Location: Left Arm)   Pulse 92   Temp 97.6 F (36.4 C)   Resp 16   SpO2 97%   Physical Exam Constitutional:      General: He is not in acute  distress.    Appearance: Normal appearance. He is not ill-appearing, toxic-appearing or diaphoretic.  HENT:     Head: Normocephalic and atraumatic.     Mouth/Throat:     Mouth: Mucous membranes are moist.     Pharynx: Oropharynx is clear.  Eyes:     General: No scleral icterus.    Extraocular Movements: Extraocular movements intact.     Pupils: Pupils are equal, round, and reactive to light.  Cardiovascular:     Rate and Rhythm: Normal rate and regular rhythm.     Pulses: Normal pulses.     Heart sounds: Normal heart sounds.  Pulmonary:     Effort: Pulmonary effort is normal. No respiratory distress.     Breath sounds: Normal breath sounds. No stridor. No wheezing, rhonchi or rales.  Chest:     Chest wall: No tenderness.  Abdominal:     General: Abdomen is flat. There is no distension.     Palpations: Abdomen is soft.     Tenderness: There is no abdominal tenderness. There is no guarding or rebound.  Musculoskeletal:        General: No swelling or tenderness. Normal range of motion.     Cervical back: Normal range of motion and neck supple. No rigidity.     Right lower leg: No edema.     Left lower leg: No edema.  Skin:    General: Skin is warm and dry.     Capillary Refill: Capillary refill takes less than 2 seconds.     Coloration: Skin is not pale.  Neurological:     General: No focal deficit present.     Mental Status: He is alert and oriented to person, place, and time.     Cranial Nerves: No cranial nerve deficit.     Sensory: No sensory deficit.     Motor: No weakness.     Coordination: Coordination normal.  Psychiatric:        Mood and Affect: Mood normal.        Behavior: Behavior normal.     ED Results / Procedures / Treatments   Labs (all labs ordered are listed, but only abnormal results are displayed) Labs Reviewed  CBC WITH DIFFERENTIAL/PLATELET - Abnormal; Notable for the following components:      Result Value   WBC 13.9 (*)    Neutro Abs 12.0 (*)     All other components within normal limits  COMPREHENSIVE METABOLIC PANEL    EKG EKG Interpretation  Date/Time:  Monday May 06 2020 11:24:45 EDT Ventricular Rate:  81 PR Interval:  142 QRS Duration: 72 QT Interval:  348 QTC Calculation: 404 R Axis:   50 Text Interpretation: Normal sinus rhythm Normal ECG rate improved from previous tracing Confirmed by Theotis Burrow [phone]) on 05/06/2020 11:51:29 AM   Radiology DG Chest 2 View  Result Date: 05/06/2020 CLINICAL DATA:  Palpitations.  Chest pain. EXAM: CHEST -  2 VIEW COMPARISON:  09/07/2018. FINDINGS: The heart size and mediastinal contours are within normal limits. Both lungs are clear. No visible pleural effusions or pneumothorax. No acute osseous abnormality. IMPRESSION: No active cardiopulmonary disease. Electronically Signed   By: Margaretha Sheffield MD   On: 05/06/2020 14:54    Procedures Procedures   Medications Ordered in ED Medications - No data to display  ED Course  I have reviewed the triage vital signs and the nursing notes.  Pertinent labs & imaging results that were available during my care of the patient were reviewed by me and considered in my medical decision making (see chart for details).    MDM Rules/Calculators/A&P                          KALIQ LEGE is a 40 y.o. male with no pertinent past medical history that presents the emergency department today for palpitations.  Patient is hemodynamically stable, no palpitations here, no tachycardia here.  Patient most likely is having PVCs or sinus tachycardia when he sees this person he does not like, most likely had panic attack today with other symptoms of nausea and feeling lightheaded.  Completely back to baseline now.  Patient does not have any cardiac disease.  EKG interpreted me without any arrhythmias or ST elevations or depressions.  Chest x-ray interpreted me without any signs of acute cardiopulmonary disease.  Labs today negative, small  leukocytosis  which is probably reactive.  No electrolyte derangements. No cardiac disease.   Symptomatic treatment discussed including reducing caffeine, anxiety tactics and avoiding has person that he does not like.  Also discussed that if the symptoms were to continue then to follow-up with a cardiologist for heart monitoring, patient agreeable with plan.  Has not had any episodes while he has been here.  Doubt need for further emergent work up at this time. I explained the diagnosis and have given explicit precautions to return to the ER including for any other new or worsening symptoms. The patient understands and accepts the medical plan as it's been dictated and I have answered their questions. Discharge instructions concerning home care and prescriptions have been given. The patient is STABLE and is discharged to home in good condition.   Final Clinical Impression(s) / ED Diagnoses Final diagnoses:  Palpitations    Rx / DC Orders ED Discharge Orders    None         Alfredia Client, PA-C 05/06/20 Fallon, Wenda Overland, MD 05/07/20 [phone]

## 2020-05-06 NOTE — ED Notes (Signed)
States his heart was racing last pm and states he has a stabbing sensation in his back off and on. States he waited til this am because he thought it would go away. Denies chest pain. States he is under a lot of stress at work and with his friends.

## 2020-05-06 NOTE — ED Triage Notes (Addendum)
States BP was 135/115 last night and that his heart races frequently.  Denies chest pain.  States he has never been seen for the tachycardia and that it is sometimes "brought on by friends."  Denies heart racing at this time.  Also states he thinks it is stress related due to working at a [employer].

## 2020-05-06 NOTE — Discharge Instructions (Signed)
Your work-up today was reassuring.  As we discussed I want you to reduce the amount of caffeine you drink, try and use the anxiety management tactics that we discussed and also use the attached instructions.  If you continue to have the symptoms then I want you to follow-up with a cardiologist, please make an appointment with them.  If you have any new worsening concerning symptoms please come back to the emergency department.  Get help right away if you: Have chest pain or shortness of breath. Have a severe headache. Feel dizzy or you faint.

## 2021-03-05 ENCOUNTER — Encounter (HOSPITAL_COMMUNITY): Payer: Self-pay | Admitting: Emergency Medicine

## 2021-03-05 ENCOUNTER — Other Ambulatory Visit: Payer: Self-pay

## 2021-03-05 ENCOUNTER — Emergency Department (HOSPITAL_COMMUNITY): Payer: Worker's Compensation

## 2021-03-05 ENCOUNTER — Emergency Department (HOSPITAL_COMMUNITY)
Admission: EM | Admit: 2021-03-05 | Discharge: 2021-03-06 | Disposition: A | Discharge status: Home / Self Care | Payer: Worker's Compensation | Attending: Emergency Medicine | Admitting: Emergency Medicine

## 2021-03-05 DIAGNOSIS — M5416 Radiculopathy, lumbar region: Secondary | ICD-10-CM | POA: Insufficient documentation

## 2021-03-05 DIAGNOSIS — Z5321 Procedure and treatment not carried out due to patient leaving prior to being seen by health care provider: Secondary | ICD-10-CM | POA: Insufficient documentation

## 2021-03-05 DIAGNOSIS — M545 Low back pain, unspecified: Secondary | ICD-10-CM | POA: Insufficient documentation

## 2021-03-05 DIAGNOSIS — M5430 Sciatica, unspecified side: Secondary | ICD-10-CM | POA: Insufficient documentation

## 2021-03-05 NOTE — ED Triage Notes (Signed)
Pt reported to ED with c/o lower back pain that radiates into right leg that began at work this evening and was so severe it almost caused him to fall. States he was recently diagnosed with sciatica approximately 3 weeks ago. Denies any actual fall, injury or trauma to affected areas.

## 2021-03-05 NOTE — ED Provider Triage Note (Signed)
Emergency Medicine Provider Triage Evaluation Note  John Beck , a 41 y.o. male  was evaluated in triage.  Pt complains of lower back pain. Pain x 3 weeks. Starts at gluteal region, will occasionally radiate down posterior bilateral legs however worse to right lower leg.  Patient states he moved today felt a sharp, stabbing pain in right glued down his leg and he was fell to the ground.  He denies any actual falls.  No bowel or bladder incontinence, saddle paresthesia, history of IV drug use, fever.  No meds PTA.  Review of Systems  Positive: Low back pain Negative: Fever, IV drug use, bowel or bladder incontinence, saddle paresthesia  Physical Exam  BP (!) 148/106 (BP Location: Right Arm)    Pulse (!) 109    Temp 98.8 F (37.1 C) (Oral)    Resp 20    SpO2 95%  Gen:   Awake, no distress   Resp:  Normal effort  MSK:   Moves extremities without difficulty  Other:    Medical Decision Making  Medically screening exam initiated at 9:13 PM.  Appropriate orders placed.  John Beck was informed that the remainder of the evaluation will be completed by another provider, this initial triage assessment does not replace that evaluation, and the importance of remaining in the ED until their evaluation is complete.  Low back pain   Marypat Kimmet A, PA-C 03/05/21 2116

## 2021-03-06 ENCOUNTER — Other Ambulatory Visit: Payer: Self-pay

## 2021-03-06 ENCOUNTER — Encounter (HOSPITAL_COMMUNITY): Payer: Self-pay | Admitting: Emergency Medicine

## 2021-03-06 ENCOUNTER — Emergency Department (HOSPITAL_COMMUNITY)
Admission: EM | Admit: 2021-03-06 | Discharge: 2021-03-06 | Disposition: A | Discharge status: Home / Self Care | Payer: Worker's Compensation | Source: Home / Self Care | Attending: Emergency Medicine | Admitting: Emergency Medicine

## 2021-03-06 DIAGNOSIS — M5416 Radiculopathy, lumbar region: Secondary | ICD-10-CM | POA: Insufficient documentation

## 2021-03-06 DIAGNOSIS — Y990 Civilian activity done for income or pay: Secondary | ICD-10-CM | POA: Insufficient documentation

## 2021-03-06 DIAGNOSIS — X500XXA Overexertion from strenuous movement or load, initial encounter: Secondary | ICD-10-CM | POA: Insufficient documentation

## 2021-03-06 MED ORDER — METHYL SALICYLATE-LIDO-MENTHOL 4-4-5 % EX PTCH: 1.0000 | MEDICATED_PATCH | Freq: Two times a day (BID) | CUTANEOUS | 0 refills | Status: AC

## 2021-03-06 MED ORDER — PREDNISONE 20 MG PO TABS: 40.0000 mg | ORAL_TABLET | Freq: Every day | ORAL | 0 refills | Status: AC

## 2021-03-06 MED ORDER — PREDNISONE 20 MG PO TABS
60.0000 mg | ORAL_TABLET | ORAL | Status: AC
  Administered 2021-03-06: 60 mg via ORAL
  Filled 2021-03-06: qty 3

## 2021-03-06 MED ORDER — DICLOFENAC EPOLAMINE 1.3 % EX PTCH
1.0000 | MEDICATED_PATCH | Freq: Two times a day (BID) | CUTANEOUS | Status: DC
  Administered 2021-03-06: 1 via TRANSDERMAL
  Filled 2021-03-06: qty 1

## 2021-03-06 NOTE — Discharge Instructions (Addendum)
As discussed, your evaluation today has been largely reassuring.  But, it is important that you monitor your condition carefully, and do not hesitate to return to the ED if you develop new, or concerning changes in your condition. ? ?Otherwise, please follow-up with your physician for appropriate ongoing care. ? ?

## 2021-03-06 NOTE — ED Triage Notes (Signed)
Pt reports lower right back pain that radiates into leg. Hx sciatica

## 2021-03-06 NOTE — ED Notes (Signed)
Patient left on own accord °

## 2021-03-06 NOTE — ED Provider Notes (Signed)
[facility]-EMERGENCY DEPT Provider Note   CSN: [id number] Arrival date & time: 03/06/21  1016     History  Chief Complaint  Patient presents with   Sciatica    John Beck is a 41 y.o. male.  HPI Patient presents with right-sided low back pain rating down the right leg.  No clear precipitant, but now over the past 2 or 3 weeks he has had multiple episodes of severe pain in this area.  No abdominal pain, no incontinence, no urinary complaints, no bowel movement changes.  Minimal relief with Tylenol, ibuprofen.  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   Home Medications Prior to Admission medications   Medication Sig Start Date End Date Taking? Authorizing Provider  Methyl Salicylate-Lido-Menthol 4-4-5 % PTCH Apply 1 patch topically in the morning and at bedtime. 03/06/21  Yes Gerhard Munch, MD  predniSONE (DELTASONE) 20 MG tablet Take 2 tablets (40 mg total) by mouth daily with breakfast. For the next four days 03/06/21  Yes Gerhard Munch, MD  acetaminophen (TYLENOL) 500 MG tablet Take 1,000 mg by mouth every 6 (six) hours as needed for mild pain or fever.    [provider]  Doxylamine Succinate, Sleep, (SLEEP AID PO) Take 1 tablet by mouth at bedtime.    [provider]  ibuprofen (ADVIL) 600 MG tablet Take 1 tablet (600 mg total) by mouth every 6 (six) hours as needed. Patient not taking: Reported on 04/19/2019 07/01/18   Elson Areas, PA-C  loratadine (CLARITIN) 10 MG tablet Take 10 mg by mouth daily.    [provider]  methocarbamol (ROBAXIN) 500 MG tablet Take 1 tablet (500 mg total) by mouth 2 (two) times daily. Patient not taking: Reported on 04/19/2019 03/30/18   Graciella Freer A, PA-C  ondansetron (ZOFRAN) 4 MG tablet Take 1 tablet (4 mg total) by mouth every 8 (eight) hours as needed for nausea or vomiting. 04/19/19   Caccavale, Sophia, PA-C  triamcinolone cream (KENALOG) 0.1  % Apply 1 application topically 2 (two) times daily. Patient not taking: Reported on 03/21/2018 11/16/17   Garlon Hatchet, PA-C      Allergies    Contrast media [iodinated contrast media]    Review of Systems   Review of Systems  Constitutional:        Per HPI, otherwise negative  HENT:         Per HPI, otherwise negative  Respiratory:         Per HPI, otherwise negative  Cardiovascular:        Per HPI, otherwise negative  Gastrointestinal:  Negative for vomiting.  Endocrine:       Negative aside from HPI  Genitourinary:        Neg aside from HPI   Musculoskeletal:        Per HPI, otherwise negative  Skin: Negative.   Neurological:  Negative for syncope.   Physical Exam Updated Vital Signs BP (!) 138/101 (BP Location: Right Arm)    Pulse 90    Temp 97.9 F (36.6 C) (Oral)    Resp 18    Ht 5\' 8"  (1.727 m)    Wt 97.5 kg    SpO2 91%    BMI 32.69 kg/m  Physical Exam Vitals and nursing note reviewed.  Constitutional:      General: He is not in acute distress.    Appearance: He is well-developed.  HENT:  Head: Normocephalic and atraumatic.  Eyes:     Conjunctiva/sclera: Conjunctivae normal.  Cardiovascular:     Rate and Rhythm: Normal rate and regular rhythm.  Pulmonary:     Effort: Pulmonary effort is normal. No respiratory distress.     Breath sounds: No stridor.  Abdominal:     General: There is no distension.  Musculoskeletal:     Comments: Nml hip flexion, no deformities, unremarkable MSK exam  Skin:    General: Skin is warm and dry.  Neurological:     General: No focal deficit present.     Mental Status: He is alert and oriented to person, place, and time.    ED Results / Procedures / Treatments   Labs (all labs ordered are listed, but only abnormal results are displayed) Labs Reviewed - No data to display  EKG None  Radiology DG Lumbar Spine Complete  Result Date: 03/05/2021 CLINICAL DATA:  Low back pain. EXAM: LUMBAR SPINE - COMPLETE 4+ VIEW  COMPARISON:  None. FINDINGS: Six lumbar type vertebra. There is no acute fracture or subluxation of the lumbar spine. Multilevel mild to moderate degenerative changes with disc space narrowing and spurring most prominent at L3-L4. Lower lumbar facet arthropathy. The visualized posterior elements are intact. The soft tissues are unremarkable. IMPRESSION: 1. No acute/traumatic lumbar spine pathology. 2. Multilevel degenerative changes. Electronically Signed   By: Elgie Collard M.D.   On: 03/05/2021 21:56    Procedures Procedures    Medications Ordered in ED Medications  predniSONE (DELTASONE) tablet 60 mg (has no administration in time range)  diclofenac (FLECTOR) 1.3 % 1 patch (has no administration in time range)    ED Course/ Medical Decision Making/ A&P Adult male presents with weeks of ongoing right-sided low back pain with radiation on the right leg.  I reviewed his x-ray, interpreted the results, these were performed yesterday, but have no evidence for acute or traumatic spine pathology.  Patient's vital signs, x-ray, story consistent with radiculopathy, low suspicion for CNS or other neurologic phenomenon, no evidence for infection, bacteremia, sepsis, urinary tract disease.  Patient amenable to initiation of both topical NSAID and prednisone, discharged in stable condition.  No barriers to care. Final Clinical Impression(s) / ED Diagnoses Final diagnoses:  Lumbar radiculopathy, acute    Rx / DC Orders ED Discharge Orders          Ordered    predniSONE (DELTASONE) 20 MG tablet  Daily with breakfast        03/06/21 1043    Methyl Salicylate-Lido-Menthol 4-4-5 % PTCH  2 times daily        03/06/21 1043              Gerhard Munch, MD 03/06/21 1048

## 2021-11-03 ENCOUNTER — Emergency Department (HOSPITAL_BASED_OUTPATIENT_CLINIC_OR_DEPARTMENT_OTHER): Payer: Self-pay | Admitting: Radiology

## 2021-11-03 ENCOUNTER — Emergency Department (HOSPITAL_BASED_OUTPATIENT_CLINIC_OR_DEPARTMENT_OTHER)
Admission: EM | Admit: 2021-11-03 | Discharge: 2021-11-03 | Disposition: A | Discharge status: Home / Self Care | Payer: Self-pay | Attending: Emergency Medicine | Admitting: Emergency Medicine

## 2021-11-03 ENCOUNTER — Other Ambulatory Visit: Payer: Self-pay

## 2021-11-03 ENCOUNTER — Encounter (HOSPITAL_BASED_OUTPATIENT_CLINIC_OR_DEPARTMENT_OTHER): Payer: Self-pay

## 2021-11-03 DIAGNOSIS — R000 Tachycardia, unspecified: Secondary | ICD-10-CM | POA: Insufficient documentation

## 2021-11-03 DIAGNOSIS — R002 Palpitations: Secondary | ICD-10-CM | POA: Insufficient documentation

## 2021-11-03 LAB — BASIC METABOLIC PANEL
Anion gap: 10 (ref 5–15)
BUN: 16 mg/dL (ref 6–20)
CO2: 21 mmol/L — ABNORMAL LOW (ref 22–32)
Calcium: 10 mg/dL (ref 8.9–10.3)
Chloride: 105 mmol/L (ref 98–111)
Creatinine, Ser: 1.05 mg/dL (ref 0.61–1.24)
GFR, Estimated: >60 mL/min (ref >60)
Glucose, Bld: 90 mg/dL (ref 70–99)
Potassium: 4.1 mmol/L (ref 3.5–5.1)
Sodium: 136 mmol/L (ref 135–145)

## 2021-11-03 LAB — CBC
HCT: 49.6 % (ref 39.0–52.0)
Hemoglobin: 16.8 g/dL (ref 13.0–17.0)
MCH: 28.4 pg (ref 26.0–34.0)
MCHC: 33.9 g/dL (ref 30.0–36.0)
MCV: 83.9 fL (ref 80.0–100.0)
Platelets: 307 10*3/uL (ref 150–400)
RBC: 5.91 MIL/uL — ABNORMAL HIGH (ref 4.22–5.81)
RDW: 12.5 % (ref 11.5–15.5)
WBC: 9.4 10*3/uL (ref 4.0–10.5)
nRBC: 0 % (ref 0.0–0.2)

## 2021-11-03 LAB — TROPONIN I (HIGH SENSITIVITY): Troponin I (High Sensitivity): <2 ng/L (ref <18)

## 2021-11-03 NOTE — ED Provider Notes (Signed)
[facility] EMERGENCY DEPT Provider Note   CSN: [id number] Arrival date & time: 11/03/21  1427     History  Chief Complaint  Patient presents with   Tachycardia    John Beck is a 41 y.o. male.  Pt is a 41 yo male presenting for palpitations. Pt states last night at [facility]  he began having feelings of his heart racing while he was working at [employer]. States symptoms lasted approx 3 hours before improving. Pt denies sob or chest pain. States "It felt like a panic attack. I usually have those at work bc of being busy and having people watching you while you work". Pt states it came back around 1PM today but was only momentary and resolved. Drinks 4-5 cans of pop a day.    Denies hx of HTN, hyperlipidemia. Never smoker.  Family hx of "leaky heart valves and an arrhythmia" in father.  The history is provided by the patient. No language interpreter was used.       Home Medications Prior to Admission medications   Medication Sig Start Date End Date Taking? Authorizing Provider  acetaminophen (TYLENOL) 500 MG tablet Take 1,000 mg by mouth every 6 (six) hours as needed for mild pain or fever.    [provider]  Doxylamine Succinate, Sleep, (SLEEP AID PO) Take 1 tablet by mouth at bedtime.    [provider]  ibuprofen (ADVIL) 600 MG tablet Take 1 tablet (600 mg total) by mouth every 6 (six) hours as needed. Patient not taking: Reported on 04/19/2019 07/01/18   Elson Areas, PA-C  loratadine (CLARITIN) 10 MG tablet Take 10 mg by mouth daily.    [provider]  methocarbamol (ROBAXIN) 500 MG tablet Take 1 tablet (500 mg total) by mouth 2 (two) times daily. Patient not taking: Reported on 04/19/2019 03/30/18   Maxwell Caul, PA-C  Methyl Salicylate-Lido-Menthol 4-4-5 % PTCH Apply 1 patch topically in the morning and at bedtime. 03/06/21   Gerhard Munch, MD  ondansetron (ZOFRAN) 4 MG tablet Take 1 tablet (4 mg total) by mouth every 8  (eight) hours as needed for nausea or vomiting. 04/19/19   Caccavale, Sophia, PA-C  predniSONE (DELTASONE) 20 MG tablet Take 2 tablets (40 mg total) by mouth daily with breakfast. For the next four days 03/06/21   Gerhard Munch, MD  triamcinolone cream (KENALOG) 0.1 % Apply 1 application topically 2 (two) times daily. Patient not taking: Reported on 03/21/2018 11/16/17   Garlon Hatchet, PA-C      Allergies    Contrast media [iodinated contrast media]    Review of Systems   Review of Systems  Constitutional:  Negative for chills and fever.  HENT:  Negative for ear pain and sore throat.   Eyes:  Negative for pain and visual disturbance.  Respiratory:  Negative for cough and shortness of breath.   Cardiovascular:  Positive for palpitations. Negative for chest pain.  Gastrointestinal:  Negative for abdominal pain and vomiting.  Genitourinary:  Negative for dysuria and hematuria.  Musculoskeletal:  Negative for arthralgias and back pain.  Skin:  Negative for color change and rash.  Neurological:  Negative for seizures and syncope.  All other systems reviewed and are negative.   Physical Exam Updated Vital Signs BP (!) 129/93   Pulse 80   Temp 98.7 F (37.1 C)   Resp 16   Ht 5\' 8"  (1.727 m)   Wt 97.5 kg   SpO2 99%   BMI 32.68  kg/m  Physical Exam Vitals and nursing note reviewed.  Constitutional:      General: He is not in acute distress.    Appearance: He is well-developed.  HENT:     Head: Normocephalic and atraumatic.  Eyes:     Conjunctiva/sclera: Conjunctivae normal.  Cardiovascular:     Rate and Rhythm: Normal rate and regular rhythm.     Heart sounds: No murmur heard. Pulmonary:     Effort: Pulmonary effort is normal. No respiratory distress.     Breath sounds: Normal breath sounds.  Abdominal:     Palpations: Abdomen is soft.     Tenderness: There is no abdominal tenderness.  Musculoskeletal:        General: No swelling.     Cervical back: Neck supple.   Skin:    General: Skin is warm and dry.     Capillary Refill: Capillary refill takes less than 2 seconds.  Neurological:     Mental Status: He is alert.  Psychiatric:        Mood and Affect: Mood normal.     ED Results / Procedures / Treatments   Labs (all labs ordered are listed, but only abnormal results are displayed) Labs Reviewed  BASIC METABOLIC PANEL - Abnormal; Notable for the following components:      Result Value   CO2 21 (*)    All other components within normal limits  CBC - Abnormal; Notable for the following components:   RBC 5.91 (*)    All other components within normal limits  TROPONIN I (HIGH SENSITIVITY)  TROPONIN I (HIGH SENSITIVITY)    EKG EKG Interpretation  Date/Time:  Monday November 03 2021 14:39:19 EDT Ventricular Rate:  81 PR Interval:  144 QRS Duration: 70 QT Interval:  346 QTC Calculation: 401 R Axis:   31 Text Interpretation: Normal sinus rhythm Normal ECG When compared with ECG of 06-May-2020 11:24, No significant change was found Confirmed by Edwin Dada ([id number]) on 11/03/2021 7:37:21 PM  Radiology DG Chest 2 View  Result Date: 11/03/2021 CLINICAL DATA:  Shortness of breath EXAM: CHEST - 2 VIEW COMPARISON:  05/06/2020 FINDINGS: The heart size and mediastinal contours are within normal limits. Both lungs are clear. The visualized skeletal structures are unremarkable. IMPRESSION: No active cardiopulmonary disease. Electronically Signed   By: Ernie Avena M.D.   On: 11/03/2021 15:15    Procedures Procedures    Medications Ordered in ED Medications - No data to display  ED Course/ Medical Decision Making/ A&P                           Medical Decision Making Amount and/or Complexity of Data Reviewed Labs: ordered. Radiology: ordered.   7:48 PM 41 yo male presenting for palpitations... The patient's chest pain is not suggestive of pulmonary embolus, cardiac ischemia, aortic dissection, pericarditis, myocarditis, pulmonary  embolism, pneumothorax, pneumonia, Zoster, or esophageal perforation, or other serious etiology.  Historically not abrupt in onset, tearing or ripping, pulses symmetric. EKG nonspecific for ischemia/infarction. No dysrhythmias, brugada, WPW, prolonged QT noted. [CXR reviewed and WNL] [Troponin negative x2. CXR reviewed. Labs without demonstration of acute pathology unless otherwise noted above.   Low HEART Score: 0-3 points (0.9-1.7% risk of MACE).] Given the extremely low risk of these diagnoses further testing and evaluation for these possibilities does not appear to be indicated at this time.    Pt recommended to decrease pop/caffeine use. Currently drinking 4-5 cans per day. Pt  recommended for close follow up with cardiology if symptoms continue due to family hx of heart arrhythmias.    Patient in no distress and overall condition improved here in the ED. Detailed discussions were had with the patient regarding current findings, and need for close f/u with PCP or on call doctor. The patient has been instructed to return immediately if the symptoms worsen in any way for re-evaluation. Patient verbalized understanding and is in agreement with current care plan. All questions answered prior to discharge.         Final Clinical Impression(s) / ED Diagnoses Final diagnoses:  Palpitations    Rx / DC Orders ED Discharge Orders     None         Lianne Cure, DO 49/67/59 [id number]

## 2021-11-03 NOTE — ED Triage Notes (Signed)
Patient here POV from Home.  Endorses Sensation of Tachycardia that was Last PM while the Patient was working on the [employer] at [employer]. States his Right Arm became Numb at that Time. Episode Lasted 30 minutes and then subsided when he came home.  Mild SOB. No CP. States he had another Episode of Tachycardia today while at rest. No Numbness today.   NAD noted during Triage. A&Ox4. GCS 15. Ambulatory.

## 2021-11-03 NOTE — Discharge Instructions (Addendum)
Decrease pop use from 4-5 cans a day to 1-2 a day.

## 2022-10-21 ENCOUNTER — Emergency Department (HOSPITAL_BASED_OUTPATIENT_CLINIC_OR_DEPARTMENT_OTHER): Payer: 59

## 2022-10-21 ENCOUNTER — Emergency Department (HOSPITAL_BASED_OUTPATIENT_CLINIC_OR_DEPARTMENT_OTHER)
Admission: EM | Admit: 2022-10-21 | Discharge: 2022-10-21 | Disposition: A | Discharge status: Home / Self Care | Payer: 59 | Attending: Emergency Medicine | Admitting: Emergency Medicine

## 2022-10-21 ENCOUNTER — Encounter (HOSPITAL_BASED_OUTPATIENT_CLINIC_OR_DEPARTMENT_OTHER): Payer: Self-pay | Admitting: Emergency Medicine

## 2022-10-21 ENCOUNTER — Other Ambulatory Visit: Payer: Self-pay

## 2022-10-21 DIAGNOSIS — W2100XA Struck by hit or thrown ball, unspecified type, initial encounter: Secondary | ICD-10-CM | POA: Diagnosis not present

## 2022-10-21 DIAGNOSIS — S6992XA Unspecified injury of left wrist, hand and finger(s), initial encounter: Secondary | ICD-10-CM | POA: Insufficient documentation

## 2022-10-21 MED ORDER — NAPROXEN 250 MG PO TABS
500.0000 mg | ORAL_TABLET | Freq: Once | ORAL | Status: AC
  Administered 2022-10-21: 500 mg via ORAL
  Filled 2022-10-21: qty 2

## 2022-10-21 MED ORDER — NAPROXEN 500 MG PO TABS: 500.0000 mg | ORAL_TABLET | Freq: Two times a day (BID) | ORAL | 0 refills | Status: AC

## 2022-10-21 NOTE — Discharge Instructions (Addendum)
Your x-ray of your finger did not show any fracture or dislocation.  We discussed short course of anti-inflammatories to help with your pain, please take 1 tablet twice a day with food to help with pain.  The phone number to hand specialist attached to your chart, if you wish to schedule an appointment with them.

## 2022-10-21 NOTE — ED Triage Notes (Signed)
Pt states about a month ago when he was bowling, his left middle finger became swollen after he used his bowling ball. Has not changed, still swollen, impairs him from making fist

## 2022-10-21 NOTE — ED Notes (Signed)
Reviewed discharge instructions, medications, and home care with pt. Pt verbalized understanding and had no further questions. Pt exited ED without complications.

## 2022-10-21 NOTE — ED Provider Notes (Signed)
[place] EMERGENCY DEPARTMENT AT [facility] Provider Note   CSN: [id number] Arrival date & time: 10/21/22  1205     History  No chief complaint on file.   John Beck is a 42 y.o. male.  42 y.o male with no PMH presents to the ED with a chief complaint of left middle finger injury x 1 month. Patient reports he was at a company appreciation bowling and placed his finger inside the bowling ball when he threw the bowling ball reports pain to the area since. The swelling has continued for about a month, he has not taking anything for improvement in symptoms, has not been applying ice to the area. No fever, no other complaints noted.   The history is provided by the patient.       Home Medications Prior to Admission medications   Medication Sig Start Date End Date Taking? Authorizing Provider  naproxen (NAPROSYN) 500 MG tablet Take 1 tablet (500 mg total) by mouth 2 (two) times daily for 7 days. 10/21/22 10/28/22 Yes Jaymi Tinner, Leonie Douglas, PA-C  acetaminophen (TYLENOL) 500 MG tablet Take 1,000 mg by mouth every 6 (six) hours as needed for mild pain or fever.    [provider]  Doxylamine Succinate, Sleep, (SLEEP AID PO) Take 1 tablet by mouth at bedtime.    [provider]  ibuprofen (ADVIL) 600 MG tablet Take 1 tablet (600 mg total) by mouth every 6 (six) hours as needed. Patient not taking: Reported on 04/19/2019 07/01/18   Elson Areas, PA-C  loratadine (CLARITIN) 10 MG tablet Take 10 mg by mouth daily.    [provider]  methocarbamol (ROBAXIN) 500 MG tablet Take 1 tablet (500 mg total) by mouth 2 (two) times daily. Patient not taking: Reported on 04/19/2019 03/30/18   Maxwell Caul, PA-C  Methyl Salicylate-Lido-Menthol 4-4-5 % PTCH Apply 1 patch topically in the morning and at bedtime. 03/06/21   Gerhard Munch, MD  ondansetron (ZOFRAN) 4 MG tablet Take 1 tablet (4 mg total) by mouth every 8 (eight) hours as needed for nausea or vomiting.  04/19/19   Caccavale, Sophia, PA-C  predniSONE (DELTASONE) 20 MG tablet Take 2 tablets (40 mg total) by mouth daily with breakfast. For the next four days 03/06/21   Gerhard Munch, MD  triamcinolone cream (KENALOG) 0.1 % Apply 1 application topically 2 (two) times daily. Patient not taking: Reported on 03/21/2018 11/16/17   Garlon Hatchet, PA-C      Allergies    Contrast media [iodinated contrast media]    Review of Systems   Review of Systems  Constitutional:  Negative for fever.  Musculoskeletal:  Positive for arthralgias and joint swelling.    Physical Exam Updated Vital Signs BP (!) 132/96 (BP Location: Right Arm)   Pulse 80   Temp 98.1 F (36.7 C) (Oral)   Resp 16   Wt 97.5 kg   SpO2 98%   BMI 32.69 kg/m  Physical Exam Vitals and nursing note reviewed.  Constitutional:      Appearance: Normal appearance.  HENT:     Head: Normocephalic and atraumatic.     Mouth/Throat:     Mouth: Mucous membranes are moist.  Cardiovascular:     Rate and Rhythm: Normal rate.  Pulmonary:     Effort: Pulmonary effort is normal.  Abdominal:     General: Abdomen is flat.  Musculoskeletal:        General: Swelling present.     Left hand: Swelling and  tenderness present. No bony tenderness. Normal range of motion. Normal strength. Normal sensation. There is no disruption of two-point discrimination. Normal capillary refill. Normal pulse.     Cervical back: Normal range of motion and neck supple.     Comments: Good flexion and extension of the left hand, swelling noted to the middle finger but good ROM, good capillary refill. 2+ radial pulses.   Skin:    General: Skin is warm and dry.  Neurological:     Mental Status: He is alert and oriented to person, place, and time.     ED Results / Procedures / Treatments   Labs (all labs ordered are listed, but only abnormal results are displayed) Labs Reviewed - No data to display  EKG None  Radiology DG Finger Middle Left  Result  Date: 10/21/2022 CLINICAL DATA:  Pain. EXAM: LEFT MIDDLE FINGER 2+V COMPARISON:  None Available. FINDINGS: There is no evidence of fracture or dislocation. Joint spaces appear preserved. Soft tissues are unremarkable. No radiopaque foreign body. IMPRESSION: No acute osseous abnormality. Electronically Signed   By: Hart Robinsons M.D.   On: 10/21/2022 13:39    Procedures Procedures    Medications Ordered in ED Medications  naproxen (NAPROSYN) tablet 500 mg (has no administration in time range)    ED Course/ Medical Decision Making/ A&P                                 Medical Decision Making Amount and/or Complexity of Data Reviewed Radiology: ordered.    Patient here with a chief complaint of left middle finger injury which occurred approximately 1 month ago while he was at a bowling ball game and suddenly threw the ball, he feels that he jammed his finger, that has continued to be swollen for about a month.  He has not done anything for improvement in symptoms.  There is no fever, no chills no systemic signs. No erythema or streaking in the skin to suggest cellulitis.   Xray of the left middle finger does not show any fracture versus dislocation.  We discussed management with splint, will follow-up with hand specialty at his earliest convenience.  Will also go home and a short course of anti-inflammatories to help with swelling.  Return precautions discussed at length.  Portions of this note were generated with Scientist, clinical (histocompatibility and immunogenetics). Dictation errors may occur despite best attempts at proofreading.   Final Clinical Impression(s) / ED Diagnoses Final diagnoses:  Injury of left middle finger, initial encounter    Rx / DC Orders ED Discharge Orders          Ordered    naproxen (NAPROSYN) 500 MG tablet  2 times daily        10/21/22 1400              Claude Manges, PA-C 10/21/22 1403    Margarita Grizzle, MD 10/26/22 1223

## 2023-04-22 IMAGING — CR DG LUMBAR SPINE COMPLETE 4+V
5 series · 5 of 5 positions shown · non-contrast
Comparison: None.

CLINICAL DATA: Low back pain.

EXAM:
LUMBAR SPINE - COMPLETE 4+ VIEW

[l-spine ap]
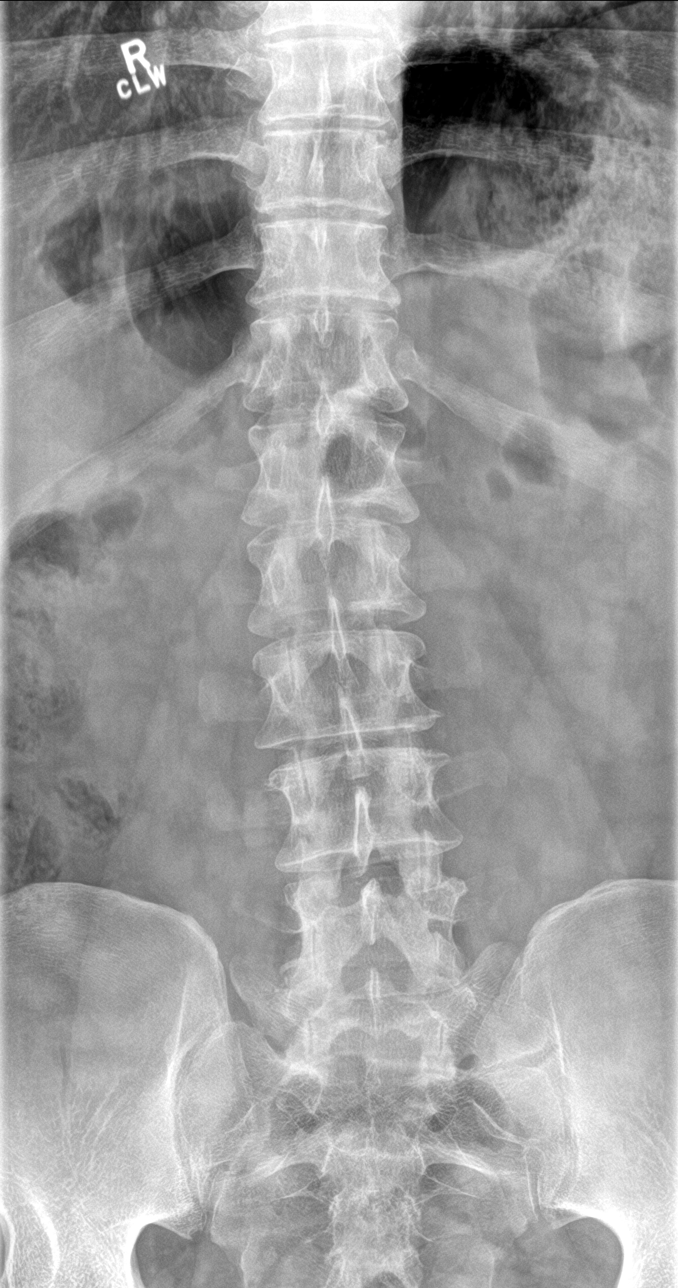

[l-spine obl (1 of 2)]
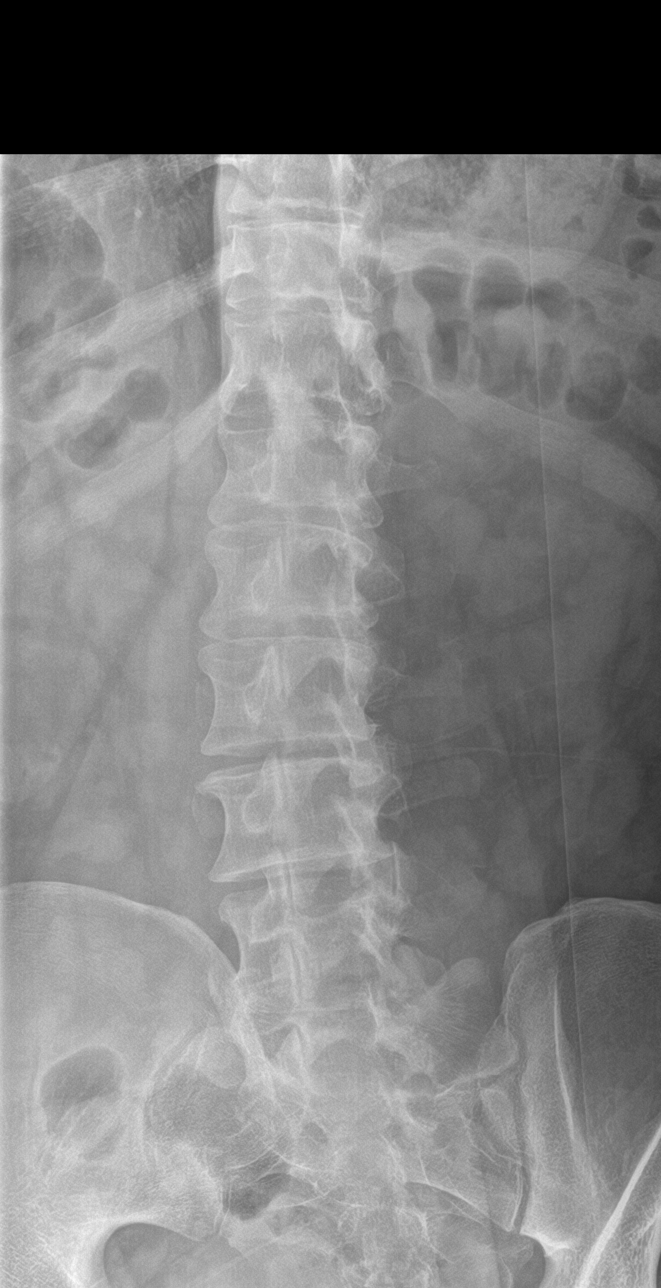

[l-spine obl (2 of 2)]
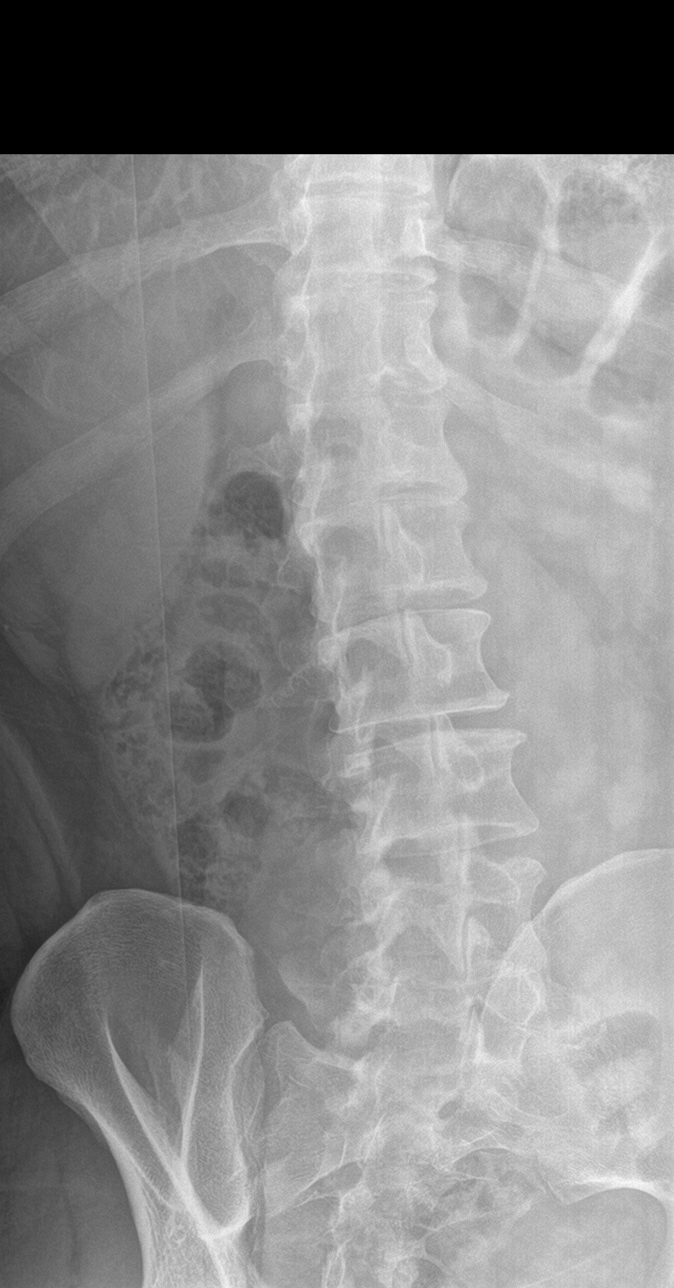

[l-spine lat]
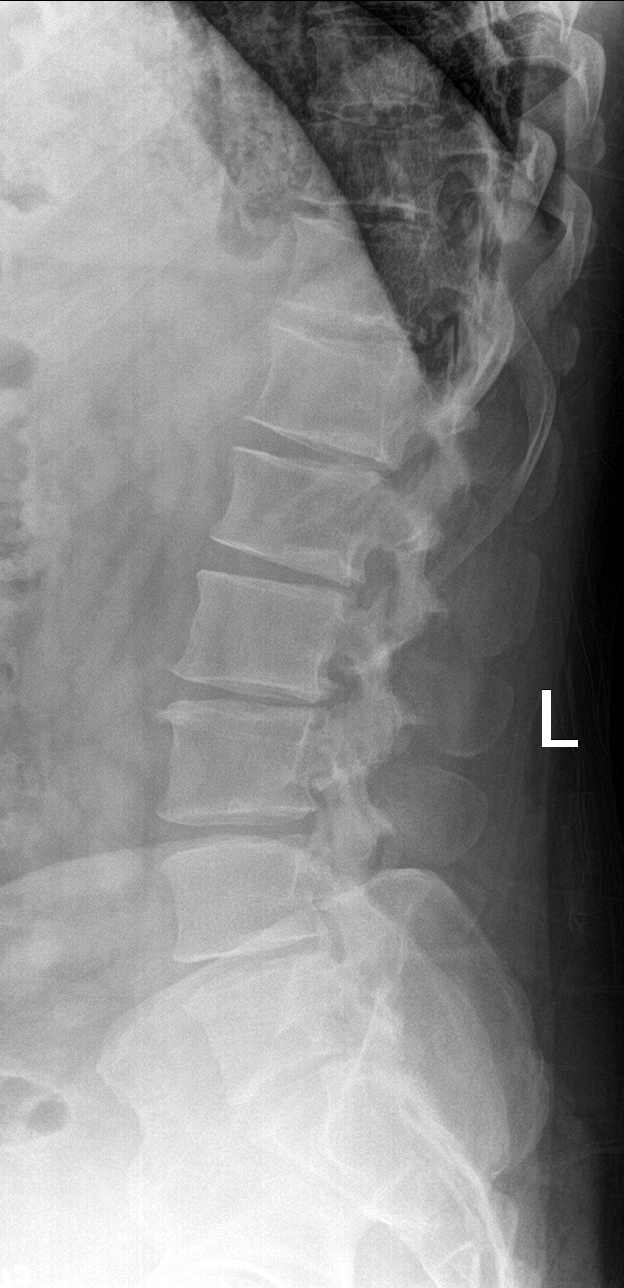

[l-spine spot]
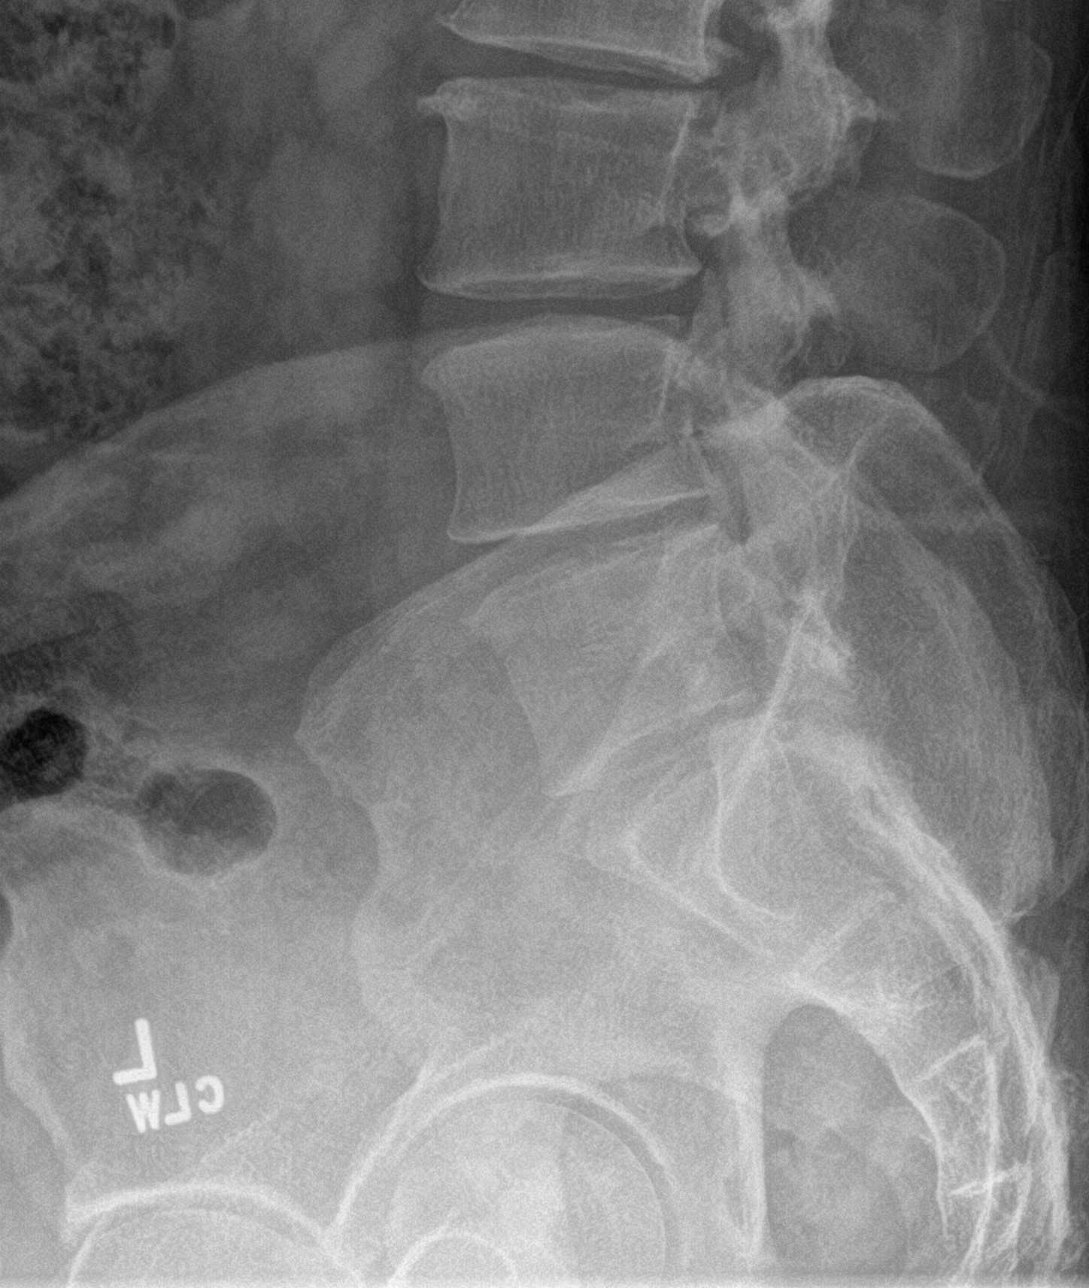

[5 of 5 positions shown; findings below may reference images not displayed]

FINDINGS: Six lumbar type vertebra. There is no acute fracture or subluxation
of the lumbar spine. Multilevel mild to moderate degenerative
changes with disc space narrowing and spurring most prominent at
L3-L4. Lower lumbar facet arthropathy. The visualized posterior
elements are intact. The soft tissues are unremarkable.
IMPRESSION: 1. No acute/traumatic lumbar spine pathology.
2. Multilevel degenerative changes.
